# Patient Record
Sex: Male | Born: 1955 | Race: White | Hispanic: No | Smoking: Never smoker
Health system: Southern US, Community
[De-identification: ages and names within clinical notes are randomized; demographics above are authoritative.]

## PROBLEM LIST (undated history)

## (undated) DIAGNOSIS — G2589 Other specified extrapyramidal and movement disorders: Secondary | ICD-10-CM

## (undated) DIAGNOSIS — J3089 Other allergic rhinitis: Secondary | ICD-10-CM

## (undated) DIAGNOSIS — H905 Unspecified sensorineural hearing loss: Secondary | ICD-10-CM

## (undated) DIAGNOSIS — J302 Other seasonal allergic rhinitis: Secondary | ICD-10-CM

## (undated) DIAGNOSIS — Z91018 Allergy to other foods: Secondary | ICD-10-CM

## (undated) DIAGNOSIS — M5412 Radiculopathy, cervical region: Secondary | ICD-10-CM

## (undated) DIAGNOSIS — M545 Low back pain, unspecified: Secondary | ICD-10-CM

## (undated) DIAGNOSIS — M7021 Olecranon bursitis, right elbow: Secondary | ICD-10-CM

## (undated) DIAGNOSIS — R197 Diarrhea, unspecified: Secondary | ICD-10-CM

## (undated) DIAGNOSIS — M109 Gout, unspecified: Secondary | ICD-10-CM

## (undated) DIAGNOSIS — M62838 Other muscle spasm: Secondary | ICD-10-CM

## (undated) DIAGNOSIS — N529 Male erectile dysfunction, unspecified: Secondary | ICD-10-CM

## (undated) DIAGNOSIS — G930 Cerebral cysts: Secondary | ICD-10-CM

## (undated) DIAGNOSIS — G43109 Migraine with aura, not intractable, without status migrainosus: Secondary | ICD-10-CM

## (undated) DIAGNOSIS — K219 Gastro-esophageal reflux disease without esophagitis: Secondary | ICD-10-CM

## (undated) DIAGNOSIS — Z789 Other specified health status: Secondary | ICD-10-CM

## (undated) DIAGNOSIS — M9901 Segmental and somatic dysfunction of cervical region: Secondary | ICD-10-CM

## (undated) DIAGNOSIS — R079 Chest pain, unspecified: Secondary | ICD-10-CM

## (undated) DIAGNOSIS — F419 Anxiety disorder, unspecified: Secondary | ICD-10-CM

## (undated) DIAGNOSIS — Z6828 Body mass index (BMI) 28.0-28.9, adult: Secondary | ICD-10-CM

## (undated) DIAGNOSIS — I10 Essential (primary) hypertension: Secondary | ICD-10-CM

## (undated) DIAGNOSIS — R413 Other amnesia: Secondary | ICD-10-CM

## (undated) DIAGNOSIS — Z860100 Personal history of colon polyps, unspecified: Secondary | ICD-10-CM

## (undated) DIAGNOSIS — D225 Melanocytic nevi of trunk: Secondary | ICD-10-CM

## (undated) DIAGNOSIS — R55 Syncope and collapse: Secondary | ICD-10-CM

## (undated) DIAGNOSIS — M1990 Unspecified osteoarthritis, unspecified site: Secondary | ICD-10-CM

## (undated) DIAGNOSIS — E785 Hyperlipidemia, unspecified: Secondary | ICD-10-CM

## (undated) DIAGNOSIS — B009 Herpesviral infection, unspecified: Secondary | ICD-10-CM

## (undated) DIAGNOSIS — F32A Depression, unspecified: Secondary | ICD-10-CM

## (undated) HISTORY — DX: Hyperlipidemia, unspecified: E78.5

## (undated) HISTORY — DX: Unspecified sensorineural hearing loss: H90.5

## (undated) HISTORY — DX: Other specified health status: Z78.9

## (undated) HISTORY — DX: Unspecified osteoarthritis, unspecified site: M19.90

## (undated) HISTORY — DX: Allergy to other foods: Z91.018

## (undated) HISTORY — DX: Gout, unspecified: M10.9

## (undated) HISTORY — DX: Syncope and collapse: R55

## (undated) HISTORY — DX: Radiculopathy, cervical region: M54.12

## (undated) HISTORY — DX: Other allergic rhinitis: J30.89

## (undated) HISTORY — PX: COLONOSCOPY: SHX174

## (undated) HISTORY — DX: Low back pain, unspecified: M54.50

## (undated) HISTORY — DX: Herpesviral infection, unspecified: B00.9

## (undated) HISTORY — DX: Cerebral cysts: G93.0

## (undated) HISTORY — DX: Diarrhea, unspecified: R19.7

## (undated) HISTORY — DX: Gastro-esophageal reflux disease without esophagitis: K21.9

## (undated) HISTORY — DX: Melanocytic nevi of trunk: D22.5

## (undated) HISTORY — PX: ANKLE SURGERY: SHX546

## (undated) HISTORY — DX: Other muscle spasm: M62.838

## (undated) HISTORY — DX: Anxiety disorder, unspecified: F41.9

## (undated) HISTORY — DX: Essential (primary) hypertension: I10

## (undated) HISTORY — DX: Other amnesia: R41.3

## (undated) HISTORY — DX: Migraine with aura, not intractable, without status migrainosus: G43.109

## (undated) HISTORY — DX: Other specified extrapyramidal and movement disorders: G25.89

## (undated) HISTORY — DX: Other seasonal allergic rhinitis: J30.2

## (undated) HISTORY — PX: POLYPECTOMY: SHX149

## (undated) HISTORY — PX: WISDOM TOOTH EXTRACTION: SHX21

## (undated) HISTORY — DX: Olecranon bursitis, right elbow: M70.21

## (undated) HISTORY — DX: Personal history of colon polyps, unspecified: Z86.0100

## (undated) HISTORY — DX: Chest pain, unspecified: R07.9

## (undated) HISTORY — DX: Segmental and somatic dysfunction of cervical region: M99.01

## (undated) HISTORY — DX: Depression, unspecified: F32.A

## (undated) HISTORY — PX: TONSILLECTOMY: SUR1361

## (undated) HISTORY — DX: Male erectile dysfunction, unspecified: N52.9

## (undated) HISTORY — DX: Body mass index (BMI) 28.0-28.9, adult: Z68.28

---

## 1999-11-30 ENCOUNTER — Encounter: Admission: RE | Admit: 1999-11-30 | Discharge: 1999-11-30 | Payer: Self-pay | Admitting: Urology

## 1999-11-30 ENCOUNTER — Encounter: Payer: Self-pay | Admitting: Urology

## 2001-04-10 ENCOUNTER — Encounter: Payer: Self-pay | Admitting: *Deleted

## 2001-04-11 ENCOUNTER — Observation Stay (HOSPITAL_COMMUNITY): Admission: EM | Admit: 2001-04-11 | Discharge: 2001-04-12 | Payer: Self-pay | Admitting: Emergency Medicine

## 2006-06-08 LAB — HM COLONOSCOPY: HM Colonoscopy: NORMAL

## 2007-10-14 ENCOUNTER — Emergency Department (HOSPITAL_BASED_OUTPATIENT_CLINIC_OR_DEPARTMENT_OTHER): Admission: EM | Admit: 2007-10-14 | Discharge: 2007-10-14 | Payer: Self-pay | Admitting: Emergency Medicine

## 2010-06-23 ENCOUNTER — Ambulatory Visit: Payer: Self-pay | Admitting: Internal Medicine
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
## 2010-07-06 ENCOUNTER — Ambulatory Visit: Payer: Self-pay | Admitting: Internal Medicine

## 2010-07-07 ENCOUNTER — Ambulatory Visit: Payer: Self-pay | Admitting: Internal Medicine

## 2010-07-11 ENCOUNTER — Ambulatory Visit: Payer: Self-pay | Admitting: Internal Medicine

## 2010-07-22 ENCOUNTER — Ambulatory Visit: Payer: Self-pay | Admitting: Internal Medicine

## 2010-08-16 ENCOUNTER — Encounter: Payer: Self-pay | Admitting: Family Medicine

## 2010-08-16 DIAGNOSIS — E785 Hyperlipidemia, unspecified: Secondary | ICD-10-CM | POA: Insufficient documentation

## 2010-08-17 ENCOUNTER — Ambulatory Visit: Payer: Self-pay | Admitting: Internal Medicine

## 2012-04-30 LAB — HEPATIC FUNCTION PANEL
ALT: 20 U/L (ref 10–40)
AST: 21 U/L (ref 14–40)

## 2012-04-30 LAB — BASIC METABOLIC PANEL
Creatinine: 1.1 mg/dL (ref 0.6–1.3)
Glucose: 104 mg/dL
Potassium: 5.1 mmol/L (ref 3.4–5.3)

## 2012-04-30 LAB — CBC AND DIFFERENTIAL
HCT: 45 % (ref 41–53)
Hemoglobin: 15.7 g/dL (ref 13.5–17.5)
Platelets: 200 10*3/uL (ref 150–399)
WBC: 6.1 10^3/mL

## 2012-04-30 LAB — LIPID PANEL
Cholesterol: 250 mg/dL — AB (ref 0–200)
HDL: 44 mg/dL (ref 35–70)
LDL Cholesterol: 181 mg/dL
Triglycerides: 105 mg/dL (ref 40–160)

## 2012-05-06 ENCOUNTER — Ambulatory Visit: Payer: Self-pay | Admitting: Family Medicine

## 2012-05-08 ENCOUNTER — Ambulatory Visit (INDEPENDENT_AMBULATORY_CARE_PROVIDER_SITE_OTHER): Payer: BC Managed Care – PPO | Admitting: Family Medicine

## 2012-05-08 ENCOUNTER — Encounter: Payer: Self-pay | Admitting: Family Medicine

## 2012-05-08 VITALS — BP 139/87 | HR 67 | Temp 98.3°F | Ht 70.0 in | Wt 193.0 lb

## 2012-05-08 DIAGNOSIS — Z125 Encounter for screening for malignant neoplasm of prostate: Secondary | ICD-10-CM

## 2012-05-08 DIAGNOSIS — D235 Other benign neoplasm of skin of trunk: Secondary | ICD-10-CM

## 2012-05-08 DIAGNOSIS — R0789 Other chest pain: Secondary | ICD-10-CM | POA: Insufficient documentation

## 2012-05-08 DIAGNOSIS — D225 Melanocytic nevi of trunk: Secondary | ICD-10-CM

## 2012-05-08 DIAGNOSIS — Z0000 Encounter for general adult medical examination without abnormal findings: Secondary | ICD-10-CM

## 2012-05-08 DIAGNOSIS — E785 Hyperlipidemia, unspecified: Secondary | ICD-10-CM

## 2012-05-08 DIAGNOSIS — R413 Other amnesia: Secondary | ICD-10-CM

## 2012-05-08 LAB — THYROID PANEL WITH TSH
Free Thyroxine Index: 2.6 (ref 1.0–3.9)
T3 Uptake: 30.6 % (ref 22.5–37.0)
T4, Total: 8.4 ug/dL (ref 5.0–12.5)
TSH: 0.984 u[IU]/mL (ref 0.350–4.500)

## 2012-05-08 LAB — PSA: PSA: 0.22 ng/mL (ref <=4.00)

## 2012-05-08 MED ORDER — PITAVASTATIN CALCIUM 2 MG PO TABS: 1.0000 | ORAL_TABLET | Freq: Every day | ORAL | Status: DC

## 2012-05-08 NOTE — Progress Notes (Signed)
Patient ID: Clifford Gomez, male   DOB: 05-05-1955, 57 y.o.   MRN: [id number] SUBJECTIVE:   HPI: Patient comes in for annual physical. He is well known to me for many years. He is in relatively good state of health. Major problem only hyperlipidemia. He has less control and treated by livalo 2 mg daily. No problems with that. On March 25 patient was seen at one of the a [facility] medicine practices.Marland Kitchen He was having atypical chest pain and it was related to stress. He's had a couple stress tests in the past and they're all normal doesn't think its heart. Get some labs done and was normal. He had EKG done and was normal. He also had his lipid panel done and that reflected elevated cholesterol. He has been out of his livalo. He is feeling fine now.  PMH/PSH: reviewed/updated in Epic  SH/FH: reviewed/updated in Epic  Allergies: reviewed/updated in Epic  Medications: reviewed/updated in Epic  Immunizations: reviewed/updated in Epic  ROS: Review of all systems is negative  OBJECTIVE:     Appearance: The patient appeared well nourished and normally developed.  Vital signs as documented. BP 139/87  Pulse 67  Temp(Src) 98.3 F (36.8 C) (Oral)  Ht 5\' 10"  (1.778 m)  Wt 193 lb (87.544 kg)  BMI 27.69 kg/m2   Head exam is unremarkable. No scleral icterus or corneal arcus noted. . Eyes: normal Ears:normal Throat: Normal  Neck is without jugular venous distension, thyromegaly, or carotid bruits. Carotid r upstrokes are brisk bilaterally..  Lungs exam:are clear to auscultation and percussion.   Cardiac exam reveals the PMI to be normally sized and situated. Rhythm is regular. First and second heart sounds normal. No murmurs, rubs or gallops.  Abdominal exam reveals normal bowel sounds, no masses, no organomegaly and no aortic enlargement.   GU: varicoceles in the left scrotum. Penis and testes normal. Normal otherwise no hernia. Rectal examination was normal prostate normal Hemoccult  negative brown stools.  Extremities are nonedematous and both femoral and pedal pulses are normal.  Neurologic: Oriented to name, place, time. Nonfocal.  Lymphatics: Normal  Vasculature: Normal peripheral pulses 2+ in extremities.  Skin examination: Multiple skin tags strawberry nevi and melanocytic nevi. None look suspicious today.  ASSESSMENT:  Annual physical exam - Diet avoids meat. Eats fish, veggies, salads.  Other and unspecified hyperlipidemia - Plan: Thyroid Panel With TSH  Atypical chest pain - occasional sharp left chest pain. Stress related. Stress tests have been normal. - Plan: Thyroid Panel With TSH  Screening for prostate cancer - Plan: PSA  Memory impairment - Mini-Mental Status exam revealed a score of 29/30. His wife is of concern that he may have a small amount of short-term memory impairment.  - Plan: Thyroid Panel With TSH  Hyperlipidemia - has been out of his statin  Melanocytic nevi of trunk - sees a dermatologist annually.   PLAN:  Orders Placed This Encounter  Procedures  . CBC and differential    This external order was created through the Results Console.  . Basic metabolic panel    This external order was created through the Results Console.  . Lipid panel    This external order was created through the Results Console.  . Hepatic function panel    This external order was created through the Results Console.  . PSA  . Thyroid Panel With TSH  . HM COLONOSCOPY    This external order was created through the Results Console.   No results  found for this or any previous visit (from the past 24 hour(s)).                               Meds ordered this encounter  Medications  . DISCONTD: Pitavastatin Calcium (LIVALO) 2 MG TABS    Sig: Take by mouth daily.  . Pitavastatin Calcium (LIVALO) 2 MG TABS    Sig: Take 1 tablet (2 mg total) by mouth daily.    Dispense:  30 tablet    Refill:  11  Diet and Exercise discussed with patient. For  nutrition information, I recommend books: Eat to Live by Dr Monico Hoar. Prevent and Reverse Heart Disease by Dr Suzzette Righter.  Exercise recommendations are:  If unable to walk, then the patient can exercise in a chair 3 times a day. By flapping arms like a bird gently and raising legs outwards to the front.  If ambulatory, the patient can go for walks for 30 minutes 3 times a week. Then increase the intensity and duration as tolerated. Goal is to try to attain exercise frequency to 5 times a week. Best to perform resistance exercises 2 days a week and cardio type exercises 3 days per week. Discussed web site www.PCRM.org  Labs in 4 months at the [place] lab beside [facility].  RTC pending labs.  Kristiann Noyce P. Modesto Charon, M.D.

## 2012-05-08 NOTE — Patient Instructions (Addendum)
Diet and Exercise discussed with patient. For nutrition information, I recommend books: Eat to Live by Dr Joel Fuhrman. Prevent and Reverse Heart Disease by Dr Caldwell Esselstyn.  Exercise recommendations are:  If unable to walk, then the patient can exercise in a chair 3 times a day. By flapping arms like a bird gently and raising legs outwards to the front.  If ambulatory, the patient can go for walks for 30 minutes 3 times a week. Then increase the intensity and duration as tolerated. Goal is to try to attain exercise frequency to 5 times a week. Best to perform resistance exercises 2 days a week and cardio type exercises 3 days per week. Hypertriglyceridemia  Diet for High blood levels of Triglycerides Most fats in food are triglycerides. Triglycerides in your blood are stored as fat in your body. High levels of triglycerides in your blood may put you at a greater risk for heart disease and stroke.  Normal triglyceride levels are less than 150 mg/dL. Borderline high levels are 150-199 mg/dl. High levels are 200 - 499 mg/dL, and very high triglyceride levels are greater than 500 mg/dL. The decision to treat high triglycerides is generally based on the level. For people with borderline or high triglyceride levels, treatment includes weight loss and exercise. Drugs are recommended for people with very high triglyceride levels. Many people who need treatment for high triglyceride levels have metabolic syndrome. This syndrome is a collection of disorders that often include: insulin resistance, high blood pressure, blood clotting problems, high cholesterol and triglycerides. TESTING PROCEDURE FOR TRIGLYCERIDES  You should not eat 4 hours before getting your triglycerides measured. The normal range of triglycerides is between 10 and 250 milligrams per deciliter (mg/dl). Some people may have extreme levels (1000 or above), but your triglyceride level may be too high if it is above 150 mg/dl, depending  on what other risk factors you have for heart disease.  People with high blood triglycerides may also have high blood cholesterol levels. If you have high blood cholesterol as well as high blood triglycerides, your risk for heart disease is probably greater than if you only had high triglycerides. High blood cholesterol is one of the main risk factors for heart disease. CHANGING YOUR DIET  Your weight can affect your blood triglyceride level. If you are more than 20% above your ideal body weight, you may be able to lower your blood triglycerides by losing weight. Eating less and exercising regularly is the best way to combat this. Fat provides more calories than any other food. The best way to lose weight is to eat less fat. Only 30% of your total calories should come from fat. Less than 7% of your diet should come from saturated fat. A diet low in fat and saturated fat is the same as a diet to decrease blood cholesterol. By eating a diet lower in fat, you may lose weight, lower your blood cholesterol, and lower your blood triglyceride level.  Eating a diet low in fat, especially saturated fat, may also help you lower your blood triglyceride level. Ask your dietitian to help you figure how much fat you can eat based on the number of calories your caregiver has prescribed for you.  Exercise, in addition to helping with weight loss may also help lower triglyceride levels.   Alcohol can increase blood triglycerides. You may need to stop drinking alcoholic beverages.  Too much carbohydrate in your diet may also increase your blood triglycerides. Some complex carbohydrates are necessary   in your diet. These may include bread, rice, potatoes, other starchy vegetables and cereals.  Reduce "simple" carbohydrates. These may include pure sugars, candy, honey, and jelly without losing other nutrients. If you have the kind of high blood triglycerides that is affected by the amount of carbohydrates in your diet, you  will need to eat less sugar and less high-sugar foods. Your caregiver can help you with this.  Adding 2-4 grams of fish oil (EPA+ DHA) may also help lower triglycerides. Speak with your caregiver before adding any supplements to your regimen. Following the Diet  Maintain your ideal weight. Your caregivers can help you with a diet. Generally, eating less food and getting more exercise will help you lose weight. Joining a weight control group may also help. Ask your caregivers for a good weight control group in your area.  Eat low-fat foods instead of high-fat foods. This can help you lose weight too.  These foods are lower in fat. Eat MORE of these:   Dried beans, peas, and lentils.  Egg whites.  Low-fat cottage cheese.  Fish.  Lean cuts of meat, such as round, sirloin, rump, and flank (cut extra fat off meat you fix).  Whole grain breads, cereals and pasta.  Skim and nonfat dry milk.  Low-fat yogurt.  Poultry without the skin.  Cheese made with skim or part-skim milk, such as mozzarella, parmesan, farmers', ricotta, or pot cheese. These are higher fat foods. Eat LESS of these:   Whole milk and foods made from whole milk, such as American, blue, cheddar, monterey jack, and swiss cheese  High-fat meats, such as luncheon meats, sausages, knockwurst, bratwurst, hot dogs, ribs, corned beef, ground pork, and regular ground beef.  Fried foods. Limit saturated fats in your diet. Substituting unsaturated fat for saturated fat may decrease your blood triglyceride level. You will need to read package labels to know which products contain saturated fats.  These foods are high in saturated fat. Eat LESS of these:   Fried pork skins.  Whole milk.  Skin and fat from poultry.  Palm oil.  Butter.  Shortening.  Cream cheese.  Bacon.  Margarines and baked goods made from listed oils.  Vegetable shortenings.  Chitterlings.  Fat from meats.  Coconut oil.  Palm kernel  oil.  Lard.  Cream.  Sour cream.  Fatback.  Coffee whiteners and non-dairy creamers made with these oils.  Cheese made from whole milk. Use unsaturated fats (both polyunsaturated and monounsaturated) moderately. Remember, even though unsaturated fats are better than saturated fats; you still want a diet low in total fat.  These foods are high in unsaturated fat:   Canola oil.  Sunflower oil.  Mayonnaise.  Almonds.  Peanuts.  Pine nuts.  Margarines made with these oils.  Safflower oil.  Olive oil.  Avocados.  Cashews.  Peanut butter.  Sunflower seeds.  Soybean oil.  Peanut oil.  Olives.  Pecans.  Walnuts.  Pumpkin seeds. Avoid sugar and other high-sugar foods. This will decrease carbohydrates without decreasing other nutrients. Sugar in your food goes rapidly to your blood. When there is excess sugar in your blood, your liver may use it to make more triglycerides. Sugar also contains calories without other important nutrients.  Eat LESS of these:   Sugar, brown sugar, powdered sugar, jam, jelly, preserves, honey, syrup, molasses, pies, candy, cakes, cookies, frosting, pastries, colas, soft drinks, punches, fruit drinks, and regular gelatin.  Avoid alcohol. Alcohol, even more than sugar, may increase blood triglycerides. In addition,   alcohol is high in calories and low in nutrients. Ask for sparkling water, or a diet soft drink instead of an alcoholic beverage. Suggestions for planning and preparing meals   Bake, broil, grill or roast meats instead of frying.  Remove fat from meats and skin from poultry before cooking.  Add spices, herbs, lemon juice or vinegar to vegetables instead of salt, rich sauces or gravies.  Use a non-stick skillet without fat or use no-stick sprays.  Cool and refrigerate stews and broth. Then remove the hardened fat floating on the surface before serving.  Refrigerate meat drippings and skim off fat to make low-fat  gravies.  Serve more fish.  Use less butter, margarine and other high-fat spreads on bread or vegetables.  Use skim or reconstituted non-fat dry milk for cooking.  Cook with low-fat cheeses.  Substitute low-fat yogurt or cottage cheese for all or part of the sour cream in recipes for sauces, dips or congealed salads.  Use half yogurt/half mayonnaise in salad recipes.  Substitute evaporated skim milk for cream. Evaporated skim milk or reconstituted non-fat dry milk can be whipped and substituted for whipped cream in certain recipes.  Choose fresh fruits for dessert instead of high-fat foods such as pies or cakes. Fruits are naturally low in fat. When Dining Out   Order low-fat appetizers such as fruit or vegetable juice, pasta with vegetables or tomato sauce.  Select clear, rather than cream soups.  Ask that dressings and gravies be served on the side. Then use less of them.  Order foods that are baked, broiled, poached, steamed, stir-fried, or roasted.  Ask for margarine instead of butter, and use only a small amount.  Drink sparkling water, unsweetened tea or coffee, or diet soft drinks instead of alcohol or other sweet beverages. QUESTIONS AND ANSWERS ABOUT OTHER FATS IN THE BLOOD: SATURATED FAT, TRANS FAT, AND CHOLESTEROL What is trans fat? Trans fat is a type of fat that is formed when vegetable oil is hardened through a process called hydrogenation. This process helps makes foods more solid, gives them shape, and prolongs their shelf life. Trans fats are also called hydrogenated or partially hydrogenated oils.  What do saturated fat, trans fat, and cholesterol in foods have to do with heart disease? Saturated fat, trans fat, and cholesterol in the diet all raise the level of LDL "bad" cholesterol in the blood. The higher the LDL cholesterol, the greater the risk for coronary heart disease (CHD). Saturated fat and trans fat raise LDL similarly.  What foods contain saturated  fat, trans fat, and cholesterol? High amounts of saturated fat are found in animal products, such as fatty cuts of meat, chicken skin, and full-fat dairy products like butter, whole milk, cream, and cheese, and in tropical vegetable oils such as palm, palm kernel, and coconut oil. Trans fat is found in some of the same foods as saturated fat, such as vegetable shortening, some margarines (especially hard or stick margarine), crackers, cookies, baked goods, fried foods, salad dressings, and other processed foods made with partially hydrogenated vegetable oils. Small amounts of trans fat also occur naturally in some animal products, such as milk products, beef, and lamb. Foods high in cholesterol include liver, other organ meats, egg yolks, shrimp, and full-fat dairy products. How can I use the new food label to make heart-healthy food choices? Check the Nutrition Facts panel of the food label. Choose foods lower in saturated fat, trans fat, and cholesterol. For saturated fat and cholesterol, you can also   use the Percent Daily Value (%DV): 5% DV or less is low, and 20% DV or more is high. (There is no %DV for trans fat.) Use the Nutrition Facts panel to choose foods low in saturated fat and cholesterol, and if the trans fat is not listed, read the ingredients and limit products that list shortening or hydrogenated or partially hydrogenated vegetable oil, which tend to be high in trans fat. POINTS TO REMEMBER:   Discuss your risk for heart disease with your caregivers, and take steps to reduce risk factors.  Change your diet. Choose foods that are low in saturated fat, trans fat, and cholesterol.  Add exercise to your daily routine if it is not already being done. Participate in physical activity of moderate intensity, like brisk walking, for at least 30 minutes on most, and preferably all days of the week. No time? Break the 30 minutes into three, 10-minute segments during the day.  Stop smoking. If you do  smoke, contact your caregiver to discuss ways in which they can help you quit.  Do not use street drugs.  Maintain a normal weight.  Maintain a healthy blood pressure.  Keep up with your blood work for checking the fats in your blood as directed by your caregiver. Document Released: 11/11/2003 Document Revised: 07/25/2011 Document Reviewed: 06/08/2008 [employer] Patient Information 2013 [employer].  

## 2012-05-09 NOTE — Progress Notes (Signed)
Quick Note:  Call patient. Labs normal. No change in plan. ______ 

## 2012-07-24 ENCOUNTER — Telehealth: Payer: Self-pay | Admitting: Family Medicine

## 2012-07-26 ENCOUNTER — Other Ambulatory Visit: Payer: Self-pay

## 2012-07-26 ENCOUNTER — Other Ambulatory Visit: Payer: Self-pay | Admitting: Family Medicine

## 2012-07-26 MED ORDER — PITAVASTATIN CALCIUM 2 MG PO TABS: 1.0000 | ORAL_TABLET | Freq: Every day | ORAL | Status: DC

## 2012-07-26 NOTE — Telephone Encounter (Signed)
Pt notified rx called to [address]   [phone]

## 2012-07-26 NOTE — Telephone Encounter (Signed)
Printed to be called into a new pharmacy.

## 2012-08-07 ENCOUNTER — Telehealth: Payer: Self-pay | Admitting: Family Medicine

## 2012-08-07 NOTE — Telephone Encounter (Signed)
Spoke with pt and requesting we mail [employer] savings card  Pt aware will mail savings card.

## 2012-08-21 ENCOUNTER — Telehealth: Payer: Self-pay | Admitting: Family Medicine

## 2012-08-21 NOTE — Telephone Encounter (Signed)
fpw to address 

## 2012-08-22 NOTE — Telephone Encounter (Signed)
PT NOTIFIED  

## 2012-08-22 NOTE — Telephone Encounter (Signed)
Dr Charlotte Sanes at [place] or DR Melvia Heaps at [facility]

## 2013-02-06 HISTORY — PX: COLONOSCOPY: SHX174

## 2013-03-17 ENCOUNTER — Encounter: Payer: Self-pay | Admitting: Internal Medicine

## 2013-04-01 ENCOUNTER — Ambulatory Visit (AMBULATORY_SURGERY_CENTER): Payer: Self-pay

## 2013-04-01 VITALS — Ht 70.0 in | Wt 196.8 lb

## 2013-04-01 DIAGNOSIS — Z1211 Encounter for screening for malignant neoplasm of colon: Secondary | ICD-10-CM

## 2013-04-01 MED ORDER — MOVIPREP 100 G PO SOLR: ORAL | Status: DC

## 2013-04-03 ENCOUNTER — Encounter: Payer: Self-pay | Admitting: Internal Medicine

## 2013-04-15 ENCOUNTER — Encounter: Payer: Self-pay | Admitting: Internal Medicine

## 2013-04-15 ENCOUNTER — Ambulatory Visit (AMBULATORY_SURGERY_CENTER): Payer: BC Managed Care – PPO | Admitting: Internal Medicine

## 2013-04-15 VITALS — BP 135/73 | HR 77 | Temp 97.4°F | Resp 20 | Ht 70.0 in | Wt 196.0 lb

## 2013-04-15 DIAGNOSIS — Z1211 Encounter for screening for malignant neoplasm of colon: Secondary | ICD-10-CM

## 2013-04-15 DIAGNOSIS — D126 Benign neoplasm of colon, unspecified: Secondary | ICD-10-CM

## 2013-04-15 MED ORDER — SODIUM CHLORIDE 0.9 % IV SOLN: 500.0000 mL | INTRAVENOUS | Status: DC

## 2013-04-15 NOTE — Op Note (Signed)
[place]  [address]. [place], [id number]   COLONOSCOPY PROCEDURE REPORT  PATIENT: Clifford Gomez, Clifford Gomez  MR#: [id number] BIRTHDATE: 27-Apr-1955 , 88  yrs. old GENDER: Male ENDOSCOPIST: Eustace Quail, MD REFERRED YT:KZSWFUX Jacelyn Grip, M.D. PROCEDURE DATE:  04/15/2013 PROCEDURE:   Colonoscopy with snare polypectomy First Screening Colonoscopy - Avg.  risk and is 50 yrs.  old or older - No.  Prior Negative Screening - Now for repeat screening. N/A  History of Adenoma - Now for follow-up colonoscopy & has been > or = to 3 yrs.  Yes hx of adenoma.  Has been 3 or more years since last colonoscopy.  Polyps Removed Today? Yes. ASA CLASS:   Class II INDICATIONS:Patient's personal history of colon polyps.  He  reports prior history of polyps elsewhere. said to be due for followup. No records MEDICATIONS: MAC sedation, administered by CRNA and propofol (Diprivan) 300mg  IV  DESCRIPTION OF PROCEDURE:   After the risks benefits and alternatives of the procedure were thoroughly explained, informed consent was obtained.  A digital rectal exam revealed no abnormalities of the rectum.   The LB [id number]  endoscope was introduced through the anus and advanced to the cecum, which was identified by both the appendix and ileocecal valve. No adverse events experienced.   The quality of the prep was excellent, using MoviPrep  The instrument was then slowly withdrawn as the colon was fully examined.  COLON FINDINGS:  There appeared to be a marking tattooed in the right colon.Three diminutive polyps were found in the ascending (2) and transverse colon.  A polypectomy was performed with a cold snare.  The resection was complete and the polyp tissue was completely retrieved.   The colon mucosa was otherwise normal throughout.  Retroflexed views revealed no abnormalities. The time to cecum=1 minutes 36 seconds.  Withdrawal time=15 minutes 06 seconds.  The scope was withdrawn and the  procedure completed. COMPLICATIONS: There were no complications.  ENDOSCOPIC IMPRESSION: 1.   Three diminutive polyps were found in the ascending and transverse colon; polypectomy was performed with a cold snare 2.   The colon mucosa was otherwise normal  RECOMMENDATIONS: 1. Repeat Colonoscopy in 5 years.   eSigned:  Eustace Quail, MD 04/15/2013 1:59 PM   cc: The Patient and Ulanda Edison MD

## 2013-04-15 NOTE — Patient Instructions (Addendum)
YOU HAD AN ENDOSCOPIC PROCEDURE TODAY AT THE [facility]: Refer to the procedure report that was given to you for any specific questions about what was found during the examination.  If the procedure report does not answer your questions, please call your gastroenterologist to clarify.  If you requested that your care partner not be given the details of your procedure findings, then the procedure report has been included in a sealed envelope for you to review at your convenience later.  YOU SHOULD EXPECT: Some feelings of bloating in the abdomen. Passage of more gas than usual.  Walking can help get rid of the air that was put into your GI tract during the procedure and reduce the bloating. If you had a lower endoscopy (such as a colonoscopy or flexible sigmoidoscopy) you may notice spotting of blood in your stool or on the toilet paper. If you underwent a bowel prep for your procedure, then you may not have a normal bowel movement for a few days.  DIET: Your first meal following the procedure should be a light meal and then it is ok to progress to your normal diet.  A half-sandwich or bowl of soup is an example of a good first meal.  Heavy or fried foods are harder to digest and may make you feel nauseous or bloated.  Likewise meals heavy in dairy and vegetables can cause extra gas to form and this can also increase the bloating.  Drink plenty of fluids but you should avoid alcoholic beverages for 24 hours.  ACTIVITY: Your care partner should take you home directly after the procedure.  You should plan to take it easy, moving slowly for the rest of the day.  You can resume normal activity the day after the procedure however you should NOT DRIVE or use heavy machinery for 24 hours (because of the sedation medicines used during the test).    SYMPTOMS TO REPORT IMMEDIATELY: A gastroenterologist can be reached at any hour.  During normal business hours, 8:30 AM to 5:00 PM Monday through Friday,  call [phone].  After hours and on weekends, please call the GI answering service at [phone] who will take a message and have the physician on call contact you.   Following lower endoscopy (colonoscopy or flexible sigmoidoscopy):  Excessive amounts of blood in the stool  Significant tenderness or worsening of abdominal pains  Swelling of the abdomen that is new, acute  Fever of 100F or higher  FOLLOW UP: If any biopsies were taken you will be contacted by phone or by letter within the next 1-3 weeks.  Call your gastroenterologist if you have not heard about the biopsies in 3 weeks.  Our staff will call the home number listed on your records the next business day following your procedure to check on you and address any questions or concerns that you may have at that time regarding the information given to you following your procedure. This is a courtesy call and so if there is no answer at the home number and we have not heard from you through the emergency physician on call, we will assume that you have returned to your regular daily activities without incident.  SIGNATURES/CONFIDENTIALITY: You and/or your care partner have signed paperwork which will be entered into your electronic medical record.  These signatures attest to the fact that that the information above on your After Visit Summary has been reviewed and is understood.  Full responsibility of the confidentiality of this   discharge information lies with you and/or your care-partner.  Repeat Colonoscopy in 5 years per Dr. Henrene Pastor.

## 2013-04-15 NOTE — Progress Notes (Signed)
Called to room to assist during endoscopic procedure.  Patient ID and intended procedure confirmed with present staff. Received instructions for my participation in the procedure from the performing physician.  

## 2013-04-15 NOTE — Progress Notes (Signed)
Report to pacu rn, vss, bbs=clear 

## 2013-04-15 NOTE — Progress Notes (Signed)
Wife has medical record release paper for Dr. Cristina Gong.   She wanted to check at home and be sure that it was the correct doctor.  She will send it back to Dr. Henrene Pastor.

## 2013-04-16 ENCOUNTER — Telehealth: Payer: Self-pay | Admitting: *Deleted

## 2013-04-16 ENCOUNTER — Encounter: Payer: Self-pay | Admitting: Family Medicine

## 2013-04-16 DIAGNOSIS — Z860100 Personal history of colon polyps, unspecified: Secondary | ICD-10-CM | POA: Insufficient documentation

## 2013-04-16 NOTE — Telephone Encounter (Signed)
  Follow up Call-  Call back number 04/15/2013  Post procedure Call Back phone  # [phone]  Permission to leave phone message Yes     Patient questions:  Do you have a fever, pain , or abdominal swelling? no Pain Score  0 *  Have you tolerated food without any problems? yes  Have you been able to return to your normal activities? yes  Do you have any questions about your discharge instructions: Diet   no Medications  no Follow up visit  no  Do you have questions or concerns about your Care? no  Actions: * If pain score is 4 or above: No action needed, pain <4.

## 2013-04-21 ENCOUNTER — Encounter: Payer: Self-pay | Admitting: Internal Medicine

## 2013-07-21 ENCOUNTER — Other Ambulatory Visit: Payer: Self-pay | Admitting: Dermatology

## 2013-08-21 ENCOUNTER — Other Ambulatory Visit: Payer: Self-pay | Admitting: *Deleted

## 2013-10-07 DIAGNOSIS — M5412 Radiculopathy, cervical region: Secondary | ICD-10-CM | POA: Insufficient documentation

## 2014-02-17 ENCOUNTER — Other Ambulatory Visit: Payer: Self-pay | Admitting: Dermatology

## 2015-05-17 DIAGNOSIS — M9904 Segmental and somatic dysfunction of sacral region: Secondary | ICD-10-CM | POA: Diagnosis not present

## 2015-05-17 DIAGNOSIS — M62830 Muscle spasm of back: Secondary | ICD-10-CM | POA: Diagnosis not present

## 2015-05-17 DIAGNOSIS — M9903 Segmental and somatic dysfunction of lumbar region: Secondary | ICD-10-CM | POA: Diagnosis not present

## 2015-05-17 DIAGNOSIS — M5417 Radiculopathy, lumbosacral region: Secondary | ICD-10-CM | POA: Diagnosis not present

## 2015-05-20 DIAGNOSIS — M5417 Radiculopathy, lumbosacral region: Secondary | ICD-10-CM | POA: Diagnosis not present

## 2015-05-20 DIAGNOSIS — M9904 Segmental and somatic dysfunction of sacral region: Secondary | ICD-10-CM | POA: Diagnosis not present

## 2015-05-20 DIAGNOSIS — M62830 Muscle spasm of back: Secondary | ICD-10-CM | POA: Diagnosis not present

## 2015-05-20 DIAGNOSIS — M9903 Segmental and somatic dysfunction of lumbar region: Secondary | ICD-10-CM | POA: Diagnosis not present

## 2015-06-08 DIAGNOSIS — M545 Low back pain: Secondary | ICD-10-CM | POA: Diagnosis not present

## 2015-06-08 DIAGNOSIS — Z0000 Encounter for general adult medical examination without abnormal findings: Secondary | ICD-10-CM | POA: Diagnosis not present

## 2015-06-08 DIAGNOSIS — M2550 Pain in unspecified joint: Secondary | ICD-10-CM | POA: Diagnosis not present

## 2015-06-08 DIAGNOSIS — M791 Myalgia: Secondary | ICD-10-CM | POA: Diagnosis not present

## 2015-06-08 DIAGNOSIS — Z0001 Encounter for general adult medical examination with abnormal findings: Secondary | ICD-10-CM | POA: Diagnosis not present

## 2015-06-08 DIAGNOSIS — E7800 Pure hypercholesterolemia, unspecified: Secondary | ICD-10-CM | POA: Diagnosis not present

## 2015-06-08 DIAGNOSIS — R202 Paresthesia of skin: Secondary | ICD-10-CM | POA: Diagnosis not present

## 2015-06-08 DIAGNOSIS — Z125 Encounter for screening for malignant neoplasm of prostate: Secondary | ICD-10-CM | POA: Diagnosis not present

## 2015-08-18 DIAGNOSIS — R899 Unspecified abnormal finding in specimens from other organs, systems and tissues: Secondary | ICD-10-CM | POA: Diagnosis not present

## 2015-08-18 DIAGNOSIS — Z209 Contact with and (suspected) exposure to unspecified communicable disease: Secondary | ICD-10-CM | POA: Diagnosis not present

## 2015-08-23 DIAGNOSIS — L918 Other hypertrophic disorders of the skin: Secondary | ICD-10-CM | POA: Diagnosis not present

## 2015-08-23 DIAGNOSIS — D485 Neoplasm of uncertain behavior of skin: Secondary | ICD-10-CM | POA: Diagnosis not present

## 2015-08-23 DIAGNOSIS — L570 Actinic keratosis: Secondary | ICD-10-CM | POA: Diagnosis not present

## 2015-08-23 DIAGNOSIS — D1801 Hemangioma of skin and subcutaneous tissue: Secondary | ICD-10-CM | POA: Diagnosis not present

## 2015-08-23 DIAGNOSIS — D225 Melanocytic nevi of trunk: Secondary | ICD-10-CM | POA: Diagnosis not present

## 2015-12-14 DIAGNOSIS — R899 Unspecified abnormal finding in specimens from other organs, systems and tissues: Secondary | ICD-10-CM | POA: Diagnosis not present

## 2015-12-14 DIAGNOSIS — M791 Myalgia: Secondary | ICD-10-CM | POA: Diagnosis not present

## 2015-12-14 DIAGNOSIS — E7800 Pure hypercholesterolemia, unspecified: Secondary | ICD-10-CM | POA: Diagnosis not present

## 2015-12-14 DIAGNOSIS — Z209 Contact with and (suspected) exposure to unspecified communicable disease: Secondary | ICD-10-CM | POA: Diagnosis not present

## 2015-12-15 DIAGNOSIS — M9902 Segmental and somatic dysfunction of thoracic region: Secondary | ICD-10-CM | POA: Diagnosis not present

## 2015-12-15 DIAGNOSIS — M5415 Radiculopathy, thoracolumbar region: Secondary | ICD-10-CM | POA: Diagnosis not present

## 2015-12-15 DIAGNOSIS — M9904 Segmental and somatic dysfunction of sacral region: Secondary | ICD-10-CM | POA: Diagnosis not present

## 2015-12-15 DIAGNOSIS — M9903 Segmental and somatic dysfunction of lumbar region: Secondary | ICD-10-CM | POA: Diagnosis not present

## 2016-02-29 DIAGNOSIS — D2262 Melanocytic nevi of left upper limb, including shoulder: Secondary | ICD-10-CM | POA: Diagnosis not present

## 2016-02-29 DIAGNOSIS — D2261 Melanocytic nevi of right upper limb, including shoulder: Secondary | ICD-10-CM | POA: Diagnosis not present

## 2016-02-29 DIAGNOSIS — D485 Neoplasm of uncertain behavior of skin: Secondary | ICD-10-CM | POA: Diagnosis not present

## 2016-02-29 DIAGNOSIS — D225 Melanocytic nevi of trunk: Secondary | ICD-10-CM | POA: Diagnosis not present

## 2016-02-29 DIAGNOSIS — D1801 Hemangioma of skin and subcutaneous tissue: Secondary | ICD-10-CM | POA: Diagnosis not present

## 2016-02-29 DIAGNOSIS — L820 Inflamed seborrheic keratosis: Secondary | ICD-10-CM | POA: Diagnosis not present

## 2016-02-29 DIAGNOSIS — L570 Actinic keratosis: Secondary | ICD-10-CM | POA: Diagnosis not present

## 2016-04-28 DIAGNOSIS — M9905 Segmental and somatic dysfunction of pelvic region: Secondary | ICD-10-CM | POA: Diagnosis not present

## 2016-04-28 DIAGNOSIS — M9903 Segmental and somatic dysfunction of lumbar region: Secondary | ICD-10-CM | POA: Diagnosis not present

## 2016-04-28 DIAGNOSIS — M9904 Segmental and somatic dysfunction of sacral region: Secondary | ICD-10-CM | POA: Diagnosis not present

## 2016-04-28 DIAGNOSIS — M5417 Radiculopathy, lumbosacral region: Secondary | ICD-10-CM | POA: Diagnosis not present

## 2016-05-02 DIAGNOSIS — M9904 Segmental and somatic dysfunction of sacral region: Secondary | ICD-10-CM | POA: Diagnosis not present

## 2016-05-02 DIAGNOSIS — M9903 Segmental and somatic dysfunction of lumbar region: Secondary | ICD-10-CM | POA: Diagnosis not present

## 2016-05-02 DIAGNOSIS — M5417 Radiculopathy, lumbosacral region: Secondary | ICD-10-CM | POA: Diagnosis not present

## 2016-05-02 DIAGNOSIS — M9905 Segmental and somatic dysfunction of pelvic region: Secondary | ICD-10-CM | POA: Diagnosis not present

## 2016-06-11 DIAGNOSIS — Z23 Encounter for immunization: Secondary | ICD-10-CM | POA: Diagnosis not present

## 2016-06-11 DIAGNOSIS — S91331A Puncture wound without foreign body, right foot, initial encounter: Secondary | ICD-10-CM | POA: Diagnosis not present

## 2016-06-13 DIAGNOSIS — Z125 Encounter for screening for malignant neoplasm of prostate: Secondary | ICD-10-CM | POA: Diagnosis not present

## 2016-06-13 DIAGNOSIS — Z0000 Encounter for general adult medical examination without abnormal findings: Secondary | ICD-10-CM | POA: Diagnosis not present

## 2016-06-13 DIAGNOSIS — E7800 Pure hypercholesterolemia, unspecified: Secondary | ICD-10-CM | POA: Diagnosis not present

## 2016-06-13 DIAGNOSIS — N529 Male erectile dysfunction, unspecified: Secondary | ICD-10-CM | POA: Diagnosis not present

## 2016-08-07 DIAGNOSIS — I10 Essential (primary) hypertension: Secondary | ICD-10-CM | POA: Diagnosis not present

## 2016-08-07 DIAGNOSIS — R0789 Other chest pain: Secondary | ICD-10-CM | POA: Diagnosis not present

## 2016-08-07 DIAGNOSIS — E7800 Pure hypercholesterolemia, unspecified: Secondary | ICD-10-CM | POA: Diagnosis not present

## 2016-08-07 DIAGNOSIS — R739 Hyperglycemia, unspecified: Secondary | ICD-10-CM | POA: Diagnosis not present

## 2016-08-15 DIAGNOSIS — D1800 Hemangioma unspecified site: Secondary | ICD-10-CM | POA: Diagnosis not present

## 2016-08-15 DIAGNOSIS — D1801 Hemangioma of skin and subcutaneous tissue: Secondary | ICD-10-CM | POA: Diagnosis not present

## 2016-08-15 DIAGNOSIS — L570 Actinic keratosis: Secondary | ICD-10-CM | POA: Diagnosis not present

## 2016-08-15 DIAGNOSIS — D485 Neoplasm of uncertain behavior of skin: Secondary | ICD-10-CM | POA: Diagnosis not present

## 2016-08-15 DIAGNOSIS — D2272 Melanocytic nevi of left lower limb, including hip: Secondary | ICD-10-CM | POA: Diagnosis not present

## 2016-08-15 DIAGNOSIS — D2261 Melanocytic nevi of right upper limb, including shoulder: Secondary | ICD-10-CM | POA: Diagnosis not present

## 2016-08-15 DIAGNOSIS — D2271 Melanocytic nevi of right lower limb, including hip: Secondary | ICD-10-CM | POA: Diagnosis not present

## 2016-08-17 DIAGNOSIS — R0789 Other chest pain: Secondary | ICD-10-CM | POA: Diagnosis not present

## 2016-08-18 DIAGNOSIS — R0789 Other chest pain: Secondary | ICD-10-CM | POA: Diagnosis not present

## 2016-09-28 DIAGNOSIS — M4692 Unspecified inflammatory spondylopathy, cervical region: Secondary | ICD-10-CM | POA: Diagnosis not present

## 2016-09-28 DIAGNOSIS — E7800 Pure hypercholesterolemia, unspecified: Secondary | ICD-10-CM | POA: Diagnosis not present

## 2016-09-28 DIAGNOSIS — M545 Low back pain: Secondary | ICD-10-CM | POA: Diagnosis not present

## 2016-09-28 DIAGNOSIS — R030 Elevated blood-pressure reading, without diagnosis of hypertension: Secondary | ICD-10-CM | POA: Diagnosis not present

## 2016-10-02 DIAGNOSIS — M2550 Pain in unspecified joint: Secondary | ICD-10-CM | POA: Diagnosis not present

## 2016-10-02 DIAGNOSIS — E7800 Pure hypercholesterolemia, unspecified: Secondary | ICD-10-CM | POA: Diagnosis not present

## 2016-10-20 DIAGNOSIS — R0789 Other chest pain: Secondary | ICD-10-CM | POA: Diagnosis not present

## 2016-10-20 DIAGNOSIS — I10 Essential (primary) hypertension: Secondary | ICD-10-CM | POA: Diagnosis not present

## 2016-10-20 DIAGNOSIS — M2550 Pain in unspecified joint: Secondary | ICD-10-CM | POA: Diagnosis not present

## 2016-10-20 DIAGNOSIS — E7800 Pure hypercholesterolemia, unspecified: Secondary | ICD-10-CM | POA: Diagnosis not present

## 2017-02-19 DIAGNOSIS — D2239 Melanocytic nevi of other parts of face: Secondary | ICD-10-CM | POA: Diagnosis not present

## 2017-02-19 DIAGNOSIS — L570 Actinic keratosis: Secondary | ICD-10-CM | POA: Diagnosis not present

## 2017-02-19 DIAGNOSIS — L821 Other seborrheic keratosis: Secondary | ICD-10-CM | POA: Diagnosis not present

## 2017-03-05 DIAGNOSIS — J069 Acute upper respiratory infection, unspecified: Secondary | ICD-10-CM | POA: Diagnosis not present

## 2017-03-05 DIAGNOSIS — R05 Cough: Secondary | ICD-10-CM | POA: Diagnosis not present

## 2017-04-03 DIAGNOSIS — R030 Elevated blood-pressure reading, without diagnosis of hypertension: Secondary | ICD-10-CM | POA: Diagnosis not present

## 2017-04-03 DIAGNOSIS — Z113 Encounter for screening for infections with a predominantly sexual mode of transmission: Secondary | ICD-10-CM | POA: Diagnosis not present

## 2017-04-03 DIAGNOSIS — M545 Low back pain: Secondary | ICD-10-CM | POA: Diagnosis not present

## 2017-04-03 DIAGNOSIS — B001 Herpesviral vesicular dermatitis: Secondary | ICD-10-CM | POA: Diagnosis not present

## 2017-04-03 DIAGNOSIS — R300 Dysuria: Secondary | ICD-10-CM | POA: Diagnosis not present

## 2017-04-03 DIAGNOSIS — E7800 Pure hypercholesterolemia, unspecified: Secondary | ICD-10-CM | POA: Diagnosis not present

## 2017-04-03 DIAGNOSIS — M4692 Unspecified inflammatory spondylopathy, cervical region: Secondary | ICD-10-CM | POA: Diagnosis not present

## 2017-08-13 DIAGNOSIS — I10 Essential (primary) hypertension: Secondary | ICD-10-CM | POA: Diagnosis not present

## 2017-08-13 DIAGNOSIS — E7800 Pure hypercholesterolemia, unspecified: Secondary | ICD-10-CM | POA: Diagnosis not present

## 2017-08-13 DIAGNOSIS — Z125 Encounter for screening for malignant neoplasm of prostate: Secondary | ICD-10-CM | POA: Diagnosis not present

## 2017-08-13 DIAGNOSIS — Z0000 Encounter for general adult medical examination without abnormal findings: Secondary | ICD-10-CM | POA: Diagnosis not present

## 2017-09-03 DIAGNOSIS — D224 Melanocytic nevi of scalp and neck: Secondary | ICD-10-CM | POA: Diagnosis not present

## 2017-09-03 DIAGNOSIS — L821 Other seborrheic keratosis: Secondary | ICD-10-CM | POA: Diagnosis not present

## 2017-09-03 DIAGNOSIS — D1801 Hemangioma of skin and subcutaneous tissue: Secondary | ICD-10-CM | POA: Diagnosis not present

## 2017-09-03 DIAGNOSIS — D2272 Melanocytic nevi of left lower limb, including hip: Secondary | ICD-10-CM | POA: Diagnosis not present

## 2017-09-03 DIAGNOSIS — L570 Actinic keratosis: Secondary | ICD-10-CM | POA: Diagnosis not present

## 2017-09-17 DIAGNOSIS — R197 Diarrhea, unspecified: Secondary | ICD-10-CM | POA: Diagnosis not present

## 2017-09-17 DIAGNOSIS — M791 Myalgia, unspecified site: Secondary | ICD-10-CM | POA: Diagnosis not present

## 2017-09-17 DIAGNOSIS — S0006XA Insect bite (nonvenomous) of scalp, initial encounter: Secondary | ICD-10-CM | POA: Diagnosis not present

## 2017-09-17 DIAGNOSIS — R61 Generalized hyperhidrosis: Secondary | ICD-10-CM | POA: Diagnosis not present

## 2017-09-17 DIAGNOSIS — R05 Cough: Secondary | ICD-10-CM | POA: Diagnosis not present

## 2017-09-17 DIAGNOSIS — R6883 Chills (without fever): Secondary | ICD-10-CM | POA: Diagnosis not present

## 2017-09-19 DIAGNOSIS — R197 Diarrhea, unspecified: Secondary | ICD-10-CM | POA: Diagnosis not present

## 2017-10-09 DIAGNOSIS — R61 Generalized hyperhidrosis: Secondary | ICD-10-CM | POA: Diagnosis not present

## 2017-10-09 DIAGNOSIS — R6883 Chills (without fever): Secondary | ICD-10-CM | POA: Diagnosis not present

## 2017-10-09 DIAGNOSIS — M791 Myalgia, unspecified site: Secondary | ICD-10-CM | POA: Diagnosis not present

## 2017-10-09 DIAGNOSIS — R05 Cough: Secondary | ICD-10-CM | POA: Diagnosis not present

## 2017-10-09 DIAGNOSIS — R899 Unspecified abnormal finding in specimens from other organs, systems and tissues: Secondary | ICD-10-CM | POA: Diagnosis not present

## 2017-11-07 DIAGNOSIS — M545 Low back pain: Secondary | ICD-10-CM | POA: Diagnosis not present

## 2017-11-13 DIAGNOSIS — M545 Low back pain: Secondary | ICD-10-CM | POA: Diagnosis not present

## 2017-11-15 DIAGNOSIS — M545 Low back pain: Secondary | ICD-10-CM | POA: Diagnosis not present

## 2017-11-20 DIAGNOSIS — M545 Low back pain: Secondary | ICD-10-CM | POA: Diagnosis not present

## 2017-11-29 DIAGNOSIS — M545 Low back pain: Secondary | ICD-10-CM | POA: Diagnosis not present

## 2018-02-14 DIAGNOSIS — E7800 Pure hypercholesterolemia, unspecified: Secondary | ICD-10-CM | POA: Diagnosis not present

## 2018-02-14 DIAGNOSIS — K219 Gastro-esophageal reflux disease without esophagitis: Secondary | ICD-10-CM | POA: Diagnosis not present

## 2018-02-14 DIAGNOSIS — I10 Essential (primary) hypertension: Secondary | ICD-10-CM | POA: Diagnosis not present

## 2018-02-14 DIAGNOSIS — N529 Male erectile dysfunction, unspecified: Secondary | ICD-10-CM | POA: Diagnosis not present

## 2018-04-02 DIAGNOSIS — L718 Other rosacea: Secondary | ICD-10-CM | POA: Diagnosis not present

## 2018-04-02 DIAGNOSIS — L858 Other specified epidermal thickening: Secondary | ICD-10-CM | POA: Diagnosis not present

## 2018-04-02 DIAGNOSIS — D225 Melanocytic nevi of trunk: Secondary | ICD-10-CM | POA: Diagnosis not present

## 2018-04-02 DIAGNOSIS — L821 Other seborrheic keratosis: Secondary | ICD-10-CM | POA: Diagnosis not present

## 2018-04-02 DIAGNOSIS — L570 Actinic keratosis: Secondary | ICD-10-CM | POA: Diagnosis not present

## 2018-04-19 DIAGNOSIS — I10 Essential (primary) hypertension: Secondary | ICD-10-CM | POA: Diagnosis not present

## 2018-04-19 DIAGNOSIS — E7800 Pure hypercholesterolemia, unspecified: Secondary | ICD-10-CM | POA: Diagnosis not present

## 2018-04-22 DIAGNOSIS — K219 Gastro-esophageal reflux disease without esophagitis: Secondary | ICD-10-CM | POA: Diagnosis not present

## 2018-04-22 DIAGNOSIS — N529 Male erectile dysfunction, unspecified: Secondary | ICD-10-CM | POA: Diagnosis not present

## 2018-04-22 DIAGNOSIS — I10 Essential (primary) hypertension: Secondary | ICD-10-CM | POA: Diagnosis not present

## 2018-04-22 DIAGNOSIS — E7800 Pure hypercholesterolemia, unspecified: Secondary | ICD-10-CM | POA: Diagnosis not present

## 2018-04-25 ENCOUNTER — Encounter: Payer: Self-pay | Admitting: Internal Medicine

## 2018-07-30 DIAGNOSIS — L570 Actinic keratosis: Secondary | ICD-10-CM | POA: Diagnosis not present

## 2018-07-30 DIAGNOSIS — D485 Neoplasm of uncertain behavior of skin: Secondary | ICD-10-CM | POA: Diagnosis not present

## 2018-07-30 DIAGNOSIS — D1801 Hemangioma of skin and subcutaneous tissue: Secondary | ICD-10-CM | POA: Diagnosis not present

## 2018-08-19 DIAGNOSIS — E7800 Pure hypercholesterolemia, unspecified: Secondary | ICD-10-CM | POA: Diagnosis not present

## 2018-08-19 DIAGNOSIS — K219 Gastro-esophageal reflux disease without esophagitis: Secondary | ICD-10-CM | POA: Diagnosis not present

## 2018-08-19 DIAGNOSIS — N529 Male erectile dysfunction, unspecified: Secondary | ICD-10-CM | POA: Diagnosis not present

## 2018-08-19 DIAGNOSIS — I10 Essential (primary) hypertension: Secondary | ICD-10-CM | POA: Diagnosis not present

## 2018-08-20 ENCOUNTER — Ambulatory Visit: Payer: BC Managed Care – PPO | Admitting: *Deleted

## 2018-08-20 ENCOUNTER — Other Ambulatory Visit: Payer: Self-pay

## 2018-08-20 VITALS — Ht 70.0 in | Wt 195.0 lb

## 2018-08-20 DIAGNOSIS — Z86010 Personal history of colonic polyps: Secondary | ICD-10-CM

## 2018-08-20 MED ORDER — NA SULFATE-K SULFATE-MG SULF 17.5-3.13-1.6 GM/177ML PO SOLN: 1.0000 | Freq: Once | ORAL | 0 refills | Status: AC

## 2018-08-20 NOTE — Progress Notes (Signed)

## 2018-08-26 ENCOUNTER — Other Ambulatory Visit: Payer: Self-pay

## 2018-08-26 DIAGNOSIS — Z20822 Contact with and (suspected) exposure to covid-19: Secondary | ICD-10-CM

## 2018-08-29 LAB — NOVEL CORONAVIRUS, NAA: SARS-CoV-2, NAA: NOT DETECTED

## 2018-09-03 ENCOUNTER — Encounter: Payer: Self-pay | Admitting: Internal Medicine

## 2018-12-19 DIAGNOSIS — K219 Gastro-esophageal reflux disease without esophagitis: Secondary | ICD-10-CM | POA: Diagnosis not present

## 2018-12-19 DIAGNOSIS — E7800 Pure hypercholesterolemia, unspecified: Secondary | ICD-10-CM | POA: Diagnosis not present

## 2018-12-19 DIAGNOSIS — N529 Male erectile dysfunction, unspecified: Secondary | ICD-10-CM | POA: Diagnosis not present

## 2018-12-19 DIAGNOSIS — I10 Essential (primary) hypertension: Secondary | ICD-10-CM | POA: Diagnosis not present

## 2019-03-21 DIAGNOSIS — Z1152 Encounter for screening for COVID-19: Secondary | ICD-10-CM | POA: Diagnosis not present

## 2019-03-31 ENCOUNTER — Ambulatory Visit: Payer: BC Managed Care – PPO | Attending: Internal Medicine

## 2019-03-31 DIAGNOSIS — Z20822 Contact with and (suspected) exposure to covid-19: Secondary | ICD-10-CM | POA: Diagnosis not present

## 2019-04-01 LAB — NOVEL CORONAVIRUS, NAA: SARS-CoV-2, NAA: DETECTED — AB

## 2019-05-21 DIAGNOSIS — D224 Melanocytic nevi of scalp and neck: Secondary | ICD-10-CM | POA: Diagnosis not present

## 2019-05-21 DIAGNOSIS — D2261 Melanocytic nevi of right upper limb, including shoulder: Secondary | ICD-10-CM | POA: Diagnosis not present

## 2019-05-21 DIAGNOSIS — L988 Other specified disorders of the skin and subcutaneous tissue: Secondary | ICD-10-CM | POA: Diagnosis not present

## 2019-05-21 DIAGNOSIS — D485 Neoplasm of uncertain behavior of skin: Secondary | ICD-10-CM | POA: Diagnosis not present

## 2019-05-21 DIAGNOSIS — D2239 Melanocytic nevi of other parts of face: Secondary | ICD-10-CM | POA: Diagnosis not present

## 2019-05-21 DIAGNOSIS — D2262 Melanocytic nevi of left upper limb, including shoulder: Secondary | ICD-10-CM | POA: Diagnosis not present

## 2019-05-27 DIAGNOSIS — Z8249 Family history of ischemic heart disease and other diseases of the circulatory system: Secondary | ICD-10-CM | POA: Diagnosis not present

## 2019-05-27 DIAGNOSIS — R519 Headache, unspecified: Secondary | ICD-10-CM | POA: Diagnosis not present

## 2019-05-27 DIAGNOSIS — G4482 Headache associated with sexual activity: Secondary | ICD-10-CM | POA: Diagnosis not present

## 2019-05-27 DIAGNOSIS — U071 COVID-19: Secondary | ICD-10-CM | POA: Diagnosis not present

## 2019-05-28 ENCOUNTER — Other Ambulatory Visit (HOSPITAL_BASED_OUTPATIENT_CLINIC_OR_DEPARTMENT_OTHER): Payer: Self-pay | Admitting: Family Medicine

## 2019-05-28 DIAGNOSIS — G4482 Headache associated with sexual activity: Secondary | ICD-10-CM

## 2019-06-26 DIAGNOSIS — R0789 Other chest pain: Secondary | ICD-10-CM | POA: Diagnosis not present

## 2019-06-26 DIAGNOSIS — W19XXXA Unspecified fall, initial encounter: Secondary | ICD-10-CM | POA: Diagnosis not present

## 2019-06-26 DIAGNOSIS — S20219A Contusion of unspecified front wall of thorax, initial encounter: Secondary | ICD-10-CM | POA: Diagnosis not present

## 2019-07-03 ENCOUNTER — Ambulatory Visit
Admission: RE | Admit: 2019-07-03 | Discharge: 2019-07-03 | Disposition: A | Discharge status: Home / Self Care | Payer: BC Managed Care – PPO | Source: Ambulatory Visit | Attending: Family Medicine | Admitting: Family Medicine

## 2019-07-03 ENCOUNTER — Other Ambulatory Visit: Payer: Self-pay

## 2019-07-03 DIAGNOSIS — G4482 Headache associated with sexual activity: Secondary | ICD-10-CM

## 2019-07-03 MED ORDER — GADOBENATE DIMEGLUMINE 529 MG/ML IV SOLN
18.0000 mL | Freq: Once | INTRAVENOUS | Status: AC | PRN
  Administered 2019-07-03: 18 mL via INTRAVENOUS

## 2019-07-14 DIAGNOSIS — Z8781 Personal history of (healed) traumatic fracture: Secondary | ICD-10-CM | POA: Diagnosis not present

## 2019-07-14 DIAGNOSIS — G930 Cerebral cysts: Secondary | ICD-10-CM | POA: Diagnosis not present

## 2019-07-14 DIAGNOSIS — M25571 Pain in right ankle and joints of right foot: Secondary | ICD-10-CM | POA: Diagnosis not present

## 2019-07-21 DIAGNOSIS — D485 Neoplasm of uncertain behavior of skin: Secondary | ICD-10-CM | POA: Diagnosis not present

## 2019-07-21 DIAGNOSIS — L988 Other specified disorders of the skin and subcutaneous tissue: Secondary | ICD-10-CM | POA: Diagnosis not present

## 2019-07-22 DIAGNOSIS — G930 Cerebral cysts: Secondary | ICD-10-CM | POA: Diagnosis not present

## 2019-07-22 DIAGNOSIS — I10 Essential (primary) hypertension: Secondary | ICD-10-CM | POA: Diagnosis not present

## 2019-07-22 DIAGNOSIS — Z6828 Body mass index (BMI) 28.0-28.9, adult: Secondary | ICD-10-CM | POA: Diagnosis not present

## 2019-09-22 DIAGNOSIS — Z20822 Contact with and (suspected) exposure to covid-19: Secondary | ICD-10-CM | POA: Diagnosis not present

## 2019-10-28 DIAGNOSIS — R195 Other fecal abnormalities: Secondary | ICD-10-CM | POA: Diagnosis not present

## 2019-10-28 DIAGNOSIS — K641 Second degree hemorrhoids: Secondary | ICD-10-CM | POA: Diagnosis not present

## 2019-10-29 DIAGNOSIS — R739 Hyperglycemia, unspecified: Secondary | ICD-10-CM | POA: Diagnosis not present

## 2019-10-29 DIAGNOSIS — M25552 Pain in left hip: Secondary | ICD-10-CM | POA: Diagnosis not present

## 2019-10-29 DIAGNOSIS — E7800 Pure hypercholesterolemia, unspecified: Secondary | ICD-10-CM | POA: Diagnosis not present

## 2019-10-29 DIAGNOSIS — R195 Other fecal abnormalities: Secondary | ICD-10-CM | POA: Diagnosis not present

## 2019-11-12 ENCOUNTER — Other Ambulatory Visit: Payer: BC Managed Care – PPO

## 2019-11-12 DIAGNOSIS — Z20822 Contact with and (suspected) exposure to covid-19: Secondary | ICD-10-CM | POA: Diagnosis not present

## 2019-11-13 LAB — NOVEL CORONAVIRUS, NAA: SARS-CoV-2, NAA: NOT DETECTED

## 2019-11-13 LAB — SARS-COV-2, NAA 2 DAY TAT

## 2020-05-10 ENCOUNTER — Encounter: Payer: Self-pay | Admitting: Internal Medicine

## 2020-07-05 IMAGING — MR MR MRA HEAD W/O CM
1 series · 22 of 48 positions shown · IV contrast (multihance)
Comparison: None.

CLINICAL DATA: Coital headache; left sided recurrent headache;
family history (sister) with cerebral aneurysm.

EXAM:
MRI HEAD WITHOUT AND WITH CONTRAST
MRA HEAD WITHOUT CONTRAST
TECHNIQUE: Multiplanar, multiecho pulse sequences of the brain and surrounding
structures were obtained without and with intravenous contrast.
Angiographic images of the head were obtained using MRA technique
without contrast.
CONTRAST:  18mL MULTIHANCE GADOBENATE DIMEGLUMINE 529 MG/ML IV SOLN

[Series 2: tof_3d_multi-slab · axial · 0.7mm · 0.35mm/px · z∈[-38,+60]mm · 22 of 149 slices shown]
[im 1/149]
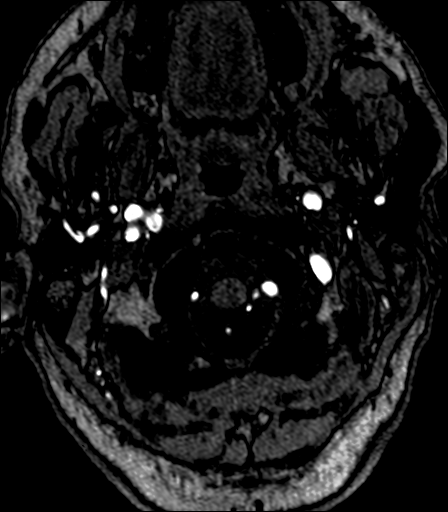
[im 4/149]
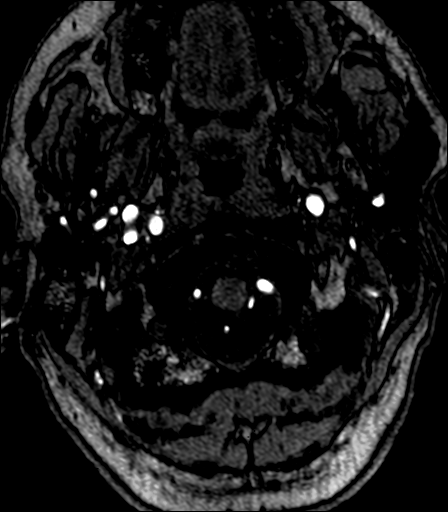
[im 7/149]
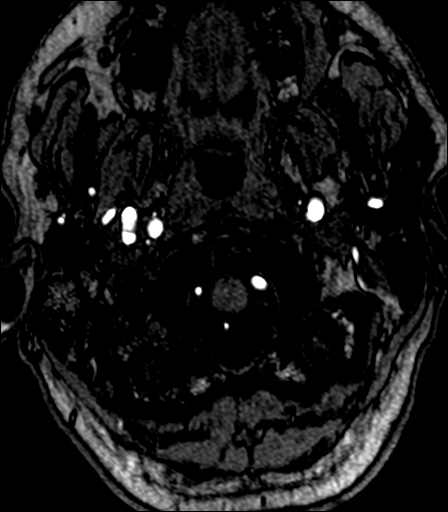
[im 10/149]
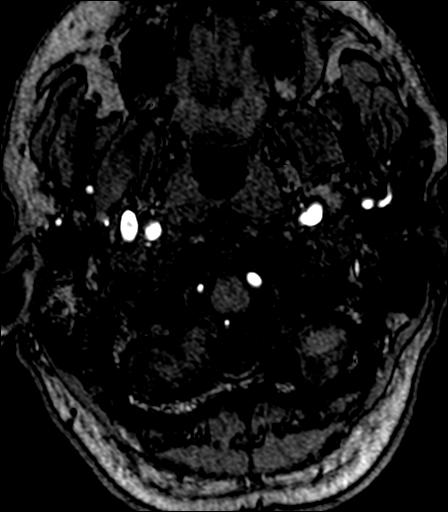
[im 13/149]
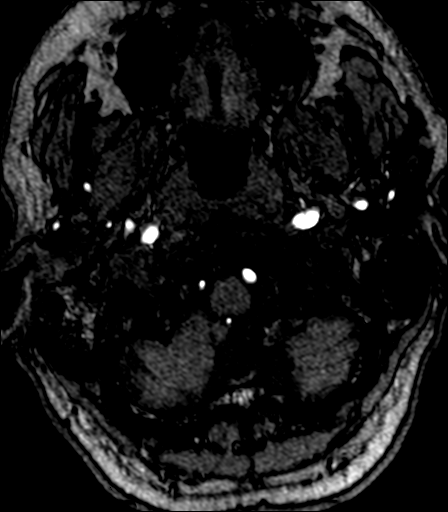
[im 16/149]
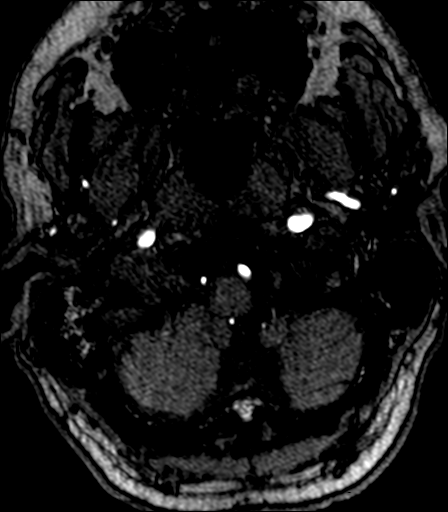
[im 19/149]
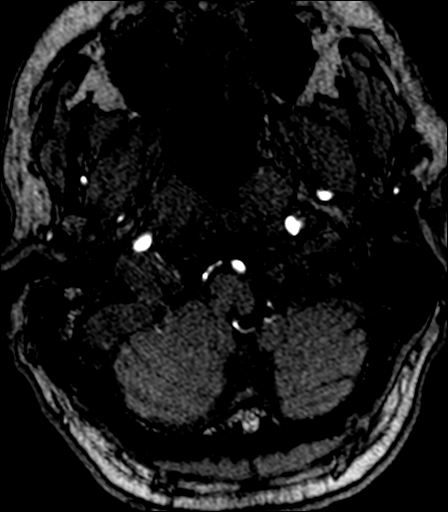
[im 23/149]
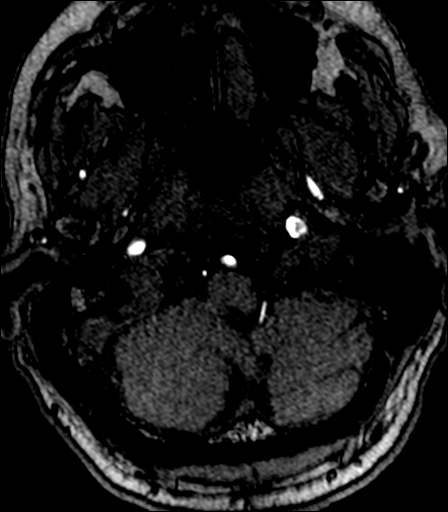
[im 26/149]
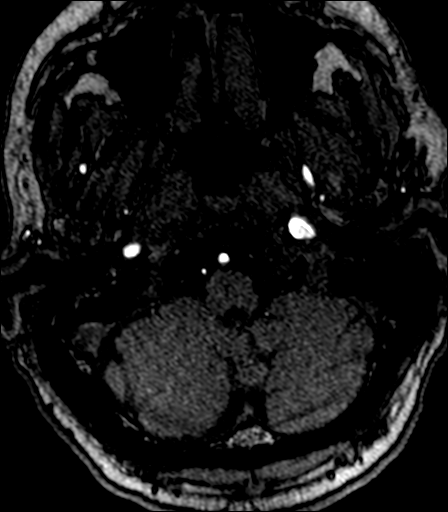
[im 29/149]
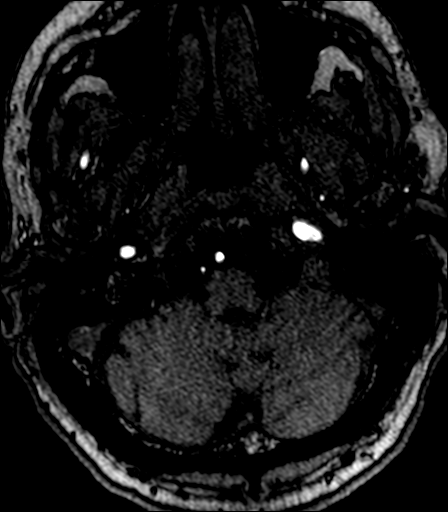
[im 32/149]
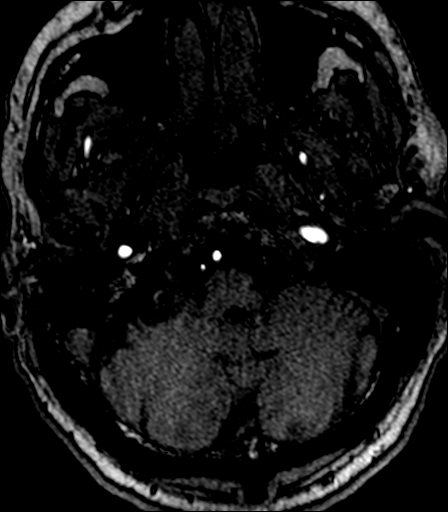
[im 35/149]
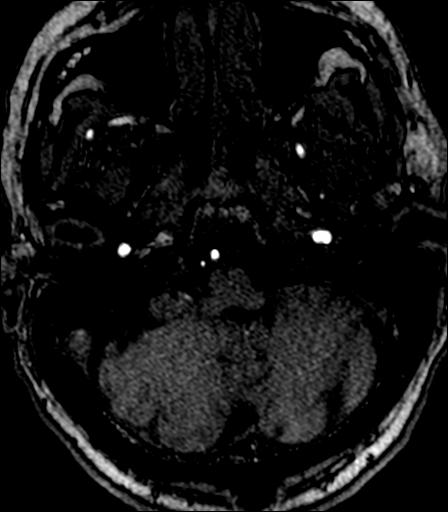
[im 38/149]
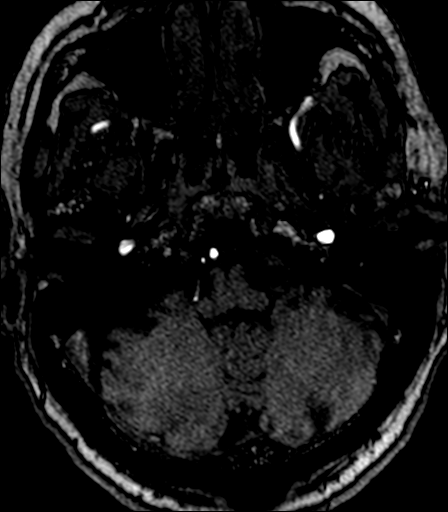
[im 41/149]
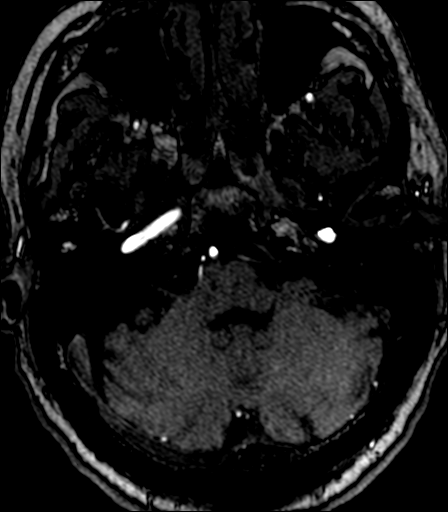
[im 48/149]
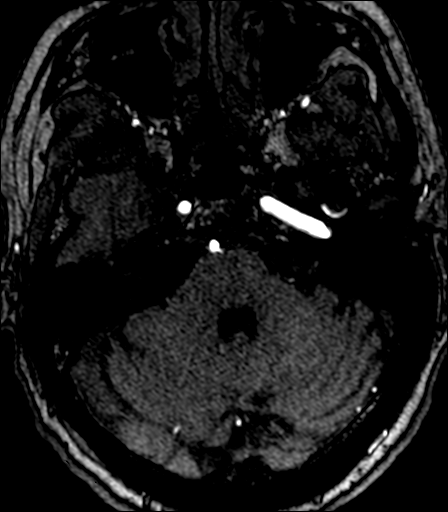
[im 67/149]
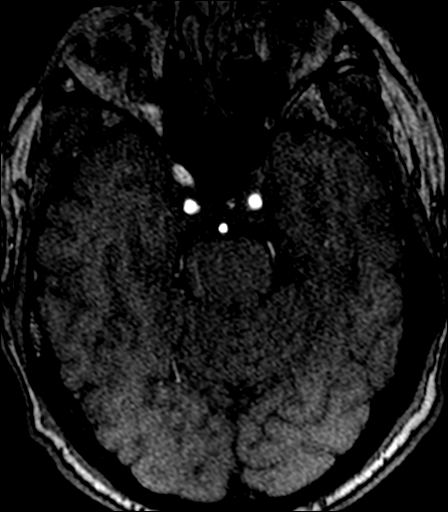
[im 76/149]
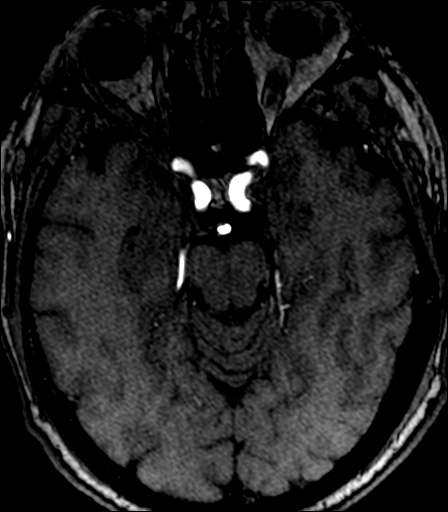
[im 86/149]
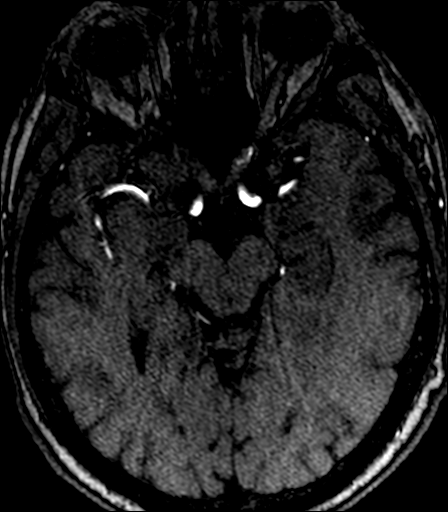
[im 104/149]
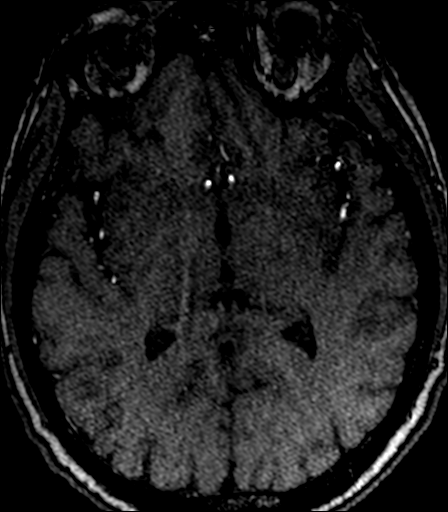
[im 123/149]
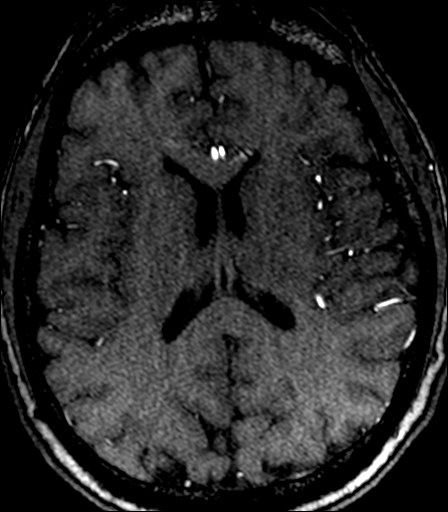
[im 126/149]
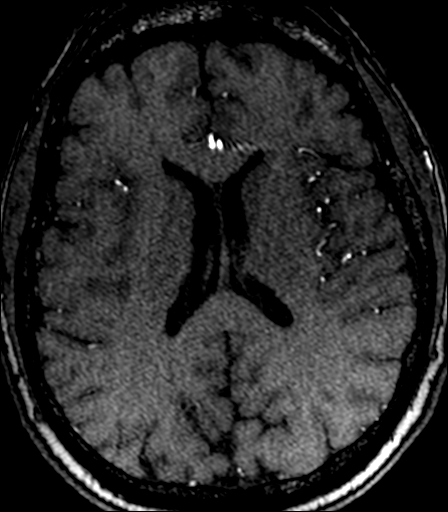
[im 142/149]
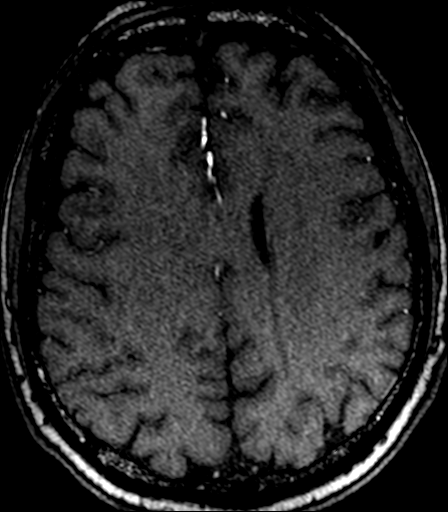

[22 of 48 positions shown; findings below may reference images not displayed]

FINDINGS: MRI HEAD FINDINGS

Brain: No acute infarction, hemorrhage or hydrocephalus. There is an
extra-axial lesion in the right anterior frontal region with signal
characteristics similar to CSF in all sequences and no evidence of
contrast enhancement. The lesion measures approximately 4.2 x 1.9 x
1.8 cm. There is mild scalloping of the inner table and mild
posterior displacement of the right frontal lobe, without
parenchymal edema or midline shift.

Vascular: Normal flow voids.

Skull and upper cervical spine: Normal marrow signal.

Sinuses/Orbits: Negative.

MRA HEAD FINDINGS

The visualized portions of the distal cervical and intracranial
internal carotid arteries are widely patent with normal flow related
enhancement. The bilateral anterior cerebral arteries and middle
cerebral arteries are widely patent with antegrade flow without
high-grade flow-limiting stenosis or proximal branch occlusion. No
intracranial aneurysm within the anterior circulation.

The vertebral arteries are patent with antegrade flow. The right
vertebral artery is hypoplastic with most of the arterial flow from
the right vertebral artery supplying the right PICA. The posterior
inferior cerebral arteries are normal. Vertebrobasilar junction and
basilar artery are widely patent with antegrade flow without
evidence of basilar stenosis or aneurysm. Posterior cerebral
arteries are normal bilaterally. No intracranial aneurysm within the
posterior circulation.
IMPRESSION: 1. No acute intracranial abnormality.
2. No brain aneurysm identified.
3. Right anterior frontal extra-axial lesion consistent with an
arachnoid cyst.

## 2020-07-22 ENCOUNTER — Ambulatory Visit (AMBULATORY_SURGERY_CENTER): Payer: Medicare Other

## 2020-07-22 ENCOUNTER — Other Ambulatory Visit: Payer: Self-pay

## 2020-07-22 VITALS — Ht 70.0 in | Wt 200.0 lb

## 2020-07-22 DIAGNOSIS — Z86010 Personal history of colonic polyps: Secondary | ICD-10-CM

## 2020-07-22 MED ORDER — NA SULFATE-K SULFATE-MG SULF 17.5-3.13-1.6 GM/177ML PO SOLN: 1.0000 | Freq: Once | ORAL | 0 refills | Status: AC

## 2020-07-22 NOTE — Progress Notes (Signed)
Pre visit completed via phone call; patient verified name, DOB, and address;  No egg or soy allergy known to patient  No issues with past sedation with any surgeries or procedures Patient denies ever being told they had issues or difficulty with intubation  No FH of Malignant Hyperthermia No diet pills per patient No home 02 use per patient  No blood thinners per patient  Pt denies issues with constipation  No A fib or A flutter  EMMI video via MyChart  COVID 19 guidelines implemented in PV today with Pt and RN   NO PA's for preps discussed with pt in PV today  Discussed with pt there will be an out-of-pocket cost for prep and that varies from $0 to 70 dollars   Due to the COVID-19 pandemic we are asking patients to follow certain guidelines.  Pt aware of COVID protocols and [facility] guidelines

## 2020-08-05 ENCOUNTER — Encounter: Payer: Self-pay | Admitting: Internal Medicine

## 2020-08-05 ENCOUNTER — Other Ambulatory Visit: Payer: Self-pay

## 2020-08-05 ENCOUNTER — Ambulatory Visit (AMBULATORY_SURGERY_CENTER): Payer: Medicare Other | Admitting: Internal Medicine

## 2020-08-05 VITALS — BP 119/71 | HR 80 | Temp 98.0°F | Resp 16 | Ht 70.0 in | Wt 200.0 lb

## 2020-08-05 DIAGNOSIS — D120 Benign neoplasm of cecum: Secondary | ICD-10-CM

## 2020-08-05 DIAGNOSIS — D124 Benign neoplasm of descending colon: Secondary | ICD-10-CM | POA: Diagnosis not present

## 2020-08-05 DIAGNOSIS — Z86010 Personal history of colonic polyps: Secondary | ICD-10-CM | POA: Diagnosis not present

## 2020-08-05 DIAGNOSIS — D122 Benign neoplasm of ascending colon: Secondary | ICD-10-CM

## 2020-08-05 MED ORDER — SODIUM CHLORIDE 0.9 % IV SOLN: 500.0000 mL | INTRAVENOUS | Status: DC

## 2020-08-05 NOTE — Progress Notes (Signed)
Called to room to assist during endoscopic procedure.  Patient ID and intended procedure confirmed with present staff. Received instructions for my participation in the procedure from the performing physician.  

## 2020-08-05 NOTE — Patient Instructions (Signed)
YOU HAD AN ENDOSCOPIC PROCEDURE TODAY AT THE [facility]:   Refer to the procedure report that was given to you for any specific questions about what was found during the examination.  If the procedure report does not answer your questions, please call your gastroenterologist to clarify.  If you requested that your care partner not be given the details of your procedure findings, then the procedure report has been included in a sealed envelope for you to review at your convenience later.  YOU SHOULD EXPECT: Some feelings of bloating in the abdomen. Passage of more gas than usual.  Walking can help get rid of the air that was put into your GI tract during the procedure and reduce the bloating. If you had a lower endoscopy (such as a colonoscopy or flexible sigmoidoscopy) you may notice spotting of blood in your stool or on the toilet paper. If you underwent a bowel prep for your procedure, you may not have a normal bowel movement for a few days.  Please Note:  You might notice some irritation and congestion in your nose or some drainage.  This is from the oxygen used during your procedure.  There is no need for concern and it should clear up in a day or so.  SYMPTOMS TO REPORT IMMEDIATELY:   Following lower endoscopy (colonoscopy or flexible sigmoidoscopy):  Excessive amounts of blood in the stool  Significant tenderness or worsening of abdominal pains  Swelling of the abdomen that is new, acute  Fever of 100F or higher   Following upper endoscopy (EGD)  Vomiting of blood or coffee ground material  New chest pain or pain under the shoulder blades  Painful or persistently difficult swallowing  New shortness of breath  Fever of 100F or higher  Black, tarry-looking stools  For urgent or emergent issues, a gastroenterologist can be reached at any hour by calling [phone]. Do not use MyChart messaging for urgent concerns.    DIET:  We do recommend a small meal at first, but  then you may proceed to your regular diet.  Drink plenty of fluids but you should avoid alcoholic beverages for 24 hours.  ACTIVITY:  You should plan to take it easy for the rest of today and you should NOT DRIVE or use heavy machinery until tomorrow (because of the sedation medicines used during the test).    FOLLOW UP: Our staff will call the number listed on your records 48-72 hours following your procedure to check on you and address any questions or concerns that you may have regarding the information given to you following your procedure. If we do not reach you, we will leave a message.  We will attempt to reach you two times.  During this call, we will ask if you have developed any symptoms of COVID 19. If you develop any symptoms (ie: fever, flu-like symptoms, shortness of breath, cough etc.) before then, please call [phone].  If you test positive for Covid 19 in the 2 weeks post procedure, please call and report this information to us.    If any biopsies were taken you will be contacted by phone or by letter within the next 1-3 weeks.  Please call us at [phone] if you have not heard about the biopsies in 3 weeks.    SIGNATURES/CONFIDENTIALITY: You and/or your care partner have signed paperwork which will be entered into your electronic medical record.  These signatures attest to the fact that that the information above on   your After Visit Summary has been reviewed and is understood.  Full responsibility of the confidentiality of this discharge information lies with you and/or your care-partner. 

## 2020-08-05 NOTE — Op Note (Signed)
[facility] Patient Name: Shown Dissinger Procedure Date: 08/05/2020 9:03 AM MRN: [id number] Endoscopist: Docia Chuck. Henrene Pastor , MD Age: 65 Referring MD:  Date of Birth: Aug 23, 1955 Gender: Male Account #: [id number] Procedure:                Colonoscopy with cold snare polypectomy x 5 Indications:              High risk colon cancer surveillance: Personal                            history of non-advanced adenomas, High risk colon                            cancer surveillance: Personal history of sessile                            serrated colon polyp (less than 10 mm in size) with                            no dysplasia. Previous examinations 2008                            (elsewhere); 2015 Medicines:                Monitored Anesthesia Care Procedure:                Pre-Anesthesia Assessment:                           - Prior to the procedure, a History and Physical                            was performed, and patient medications and                            allergies were reviewed. The patient's tolerance of                            previous anesthesia was also reviewed. The risks                            and benefits of the procedure and the sedation                            options and risks were discussed with the patient.                            All questions were answered, and informed consent                            was obtained. Prior Anticoagulants: The patient has                            taken no previous anticoagulant or antiplatelet  agents. ASA Grade Assessment: II - A patient with                            mild systemic disease. After reviewing the risks                            and benefits, the patient was deemed in                            satisfactory condition to undergo the procedure.                           After obtaining informed consent, the colonoscope                            was passed under direct  vision. Throughout the                            procedure, the patient's blood pressure, pulse, and                            oxygen saturations were monitored continuously. The                            Olympus CF-HQ190L [id number]) Colonoscope was                            introduced through the anus and advanced to the the                            cecum, identified by appendiceal orifice and                            ileocecal valve. The ileocecal valve, appendiceal                            orifice, and rectum were photographed. The quality                            of the bowel preparation was excellent. The                            colonoscopy was performed without difficulty. The                            patient tolerated the procedure well. The bowel                            preparation used was SUPREP via split dose                            instruction. Scope In: 9:15:06 AM Scope Out: 9:35:11 AM Scope Withdrawal Time: 0 hours 18 minutes 42 seconds  Total Procedure Duration: 0 hours 20 minutes  5 seconds  Findings:                 Five polyps were found in the descending colon,                            ascending colon and cecum. The polyps were 1 to 4                            mm in size. These polyps were removed with a cold                            snare. Resection and retrieval were complete.                           The exam was otherwise without abnormality on                            direct and retroflexion views. Complications:            No immediate complications. Estimated blood loss:                            None. Estimated Blood Loss:     Estimated blood loss: none. Impression:               - Five 1 to 4 mm polyps in the descending colon, in                            the ascending colon and in the cecum, removed with                            a cold snare. Resected and retrieved.                           - The examination was otherwise  normal on direct                            and retroflexion views. Recommendation:           - Repeat colonoscopy in 3 - 5 years for                            surveillance.                           - Patient has a contact number available for                            emergencies. The signs and symptoms of potential                            delayed complications were discussed with the                            patient. Return to normal activities tomorrow.  Written discharge instructions were provided to the                            patient.                           - Resume previous diet.                           - Continue present medications.                           - Await pathology results. Docia Chuck. Henrene Pastor, MD 08/05/2020 9:41:59 AM This report has been signed electronically.

## 2020-08-05 NOTE — Progress Notes (Signed)
PT taken to PACU. Monitors in place. VSS. Report given to RN. 

## 2020-08-10 ENCOUNTER — Telehealth: Payer: Self-pay

## 2020-08-10 NOTE — Telephone Encounter (Signed)
  Follow up Call-  Call back number 08/05/2020  Post procedure Call Back phone  # [phone]  Permission to leave phone message Yes  Some recent data might be hidden     Patient questions:  Do you have a fever, pain , or abdominal swelling? No. Pain Score  0 *  Have you tolerated food without any problems? Yes.    Have you been able to return to your normal activities? Yes.    Do you have any questions about your discharge instructions: Diet   No. Medications  No. Follow up visit  No.  Do you have questions or concerns about your Care? No.  Actions: * If pain score is 4 or above: No action needed, pain <4.  Have you developed a fever since your procedure? no  2.   Have you had an respiratory symptoms (SOB or cough) since your procedure? no  3.   Have you tested positive for COVID 19 since your procedure no  4.   Have you had any family members/close contacts diagnosed with the COVID 19 since your procedure?  no   If yes to any of these questions please route to Joylene John, RN and Joella Prince, RN

## 2020-08-11 ENCOUNTER — Encounter: Payer: Self-pay | Admitting: Internal Medicine

## 2020-11-01 DIAGNOSIS — Z125 Encounter for screening for malignant neoplasm of prostate: Secondary | ICD-10-CM | POA: Diagnosis not present

## 2020-11-01 DIAGNOSIS — N529 Male erectile dysfunction, unspecified: Secondary | ICD-10-CM | POA: Diagnosis not present

## 2020-11-01 DIAGNOSIS — I10 Essential (primary) hypertension: Secondary | ICD-10-CM | POA: Diagnosis not present

## 2020-11-01 DIAGNOSIS — R591 Generalized enlarged lymph nodes: Secondary | ICD-10-CM | POA: Diagnosis not present

## 2020-11-01 DIAGNOSIS — R202 Paresthesia of skin: Secondary | ICD-10-CM | POA: Diagnosis not present

## 2020-11-01 DIAGNOSIS — Z23 Encounter for immunization: Secondary | ICD-10-CM | POA: Diagnosis not present

## 2020-11-01 DIAGNOSIS — Z0000 Encounter for general adult medical examination without abnormal findings: Secondary | ICD-10-CM | POA: Diagnosis not present

## 2020-11-01 DIAGNOSIS — D126 Benign neoplasm of colon, unspecified: Secondary | ICD-10-CM | POA: Diagnosis not present

## 2020-11-01 DIAGNOSIS — Z8371 Family history of colonic polyps: Secondary | ICD-10-CM | POA: Diagnosis not present

## 2020-11-01 DIAGNOSIS — M898X1 Other specified disorders of bone, shoulder: Secondary | ICD-10-CM | POA: Diagnosis not present

## 2020-11-01 DIAGNOSIS — M79672 Pain in left foot: Secondary | ICD-10-CM | POA: Diagnosis not present

## 2020-11-01 DIAGNOSIS — E7800 Pure hypercholesterolemia, unspecified: Secondary | ICD-10-CM | POA: Diagnosis not present

## 2020-11-02 DIAGNOSIS — D1801 Hemangioma of skin and subcutaneous tissue: Secondary | ICD-10-CM | POA: Diagnosis not present

## 2020-11-02 DIAGNOSIS — L821 Other seborrheic keratosis: Secondary | ICD-10-CM | POA: Diagnosis not present

## 2020-11-02 DIAGNOSIS — L570 Actinic keratosis: Secondary | ICD-10-CM | POA: Diagnosis not present

## 2020-11-02 DIAGNOSIS — L814 Other melanin hyperpigmentation: Secondary | ICD-10-CM | POA: Diagnosis not present

## 2020-11-09 DIAGNOSIS — R591 Generalized enlarged lymph nodes: Secondary | ICD-10-CM | POA: Diagnosis not present

## 2020-11-09 DIAGNOSIS — Z125 Encounter for screening for malignant neoplasm of prostate: Secondary | ICD-10-CM | POA: Diagnosis not present

## 2020-11-09 DIAGNOSIS — E7800 Pure hypercholesterolemia, unspecified: Secondary | ICD-10-CM | POA: Diagnosis not present

## 2020-11-09 DIAGNOSIS — R202 Paresthesia of skin: Secondary | ICD-10-CM | POA: Diagnosis not present

## 2020-11-16 DIAGNOSIS — M546 Pain in thoracic spine: Secondary | ICD-10-CM | POA: Diagnosis not present

## 2020-11-16 DIAGNOSIS — M79672 Pain in left foot: Secondary | ICD-10-CM | POA: Diagnosis not present

## 2021-02-22 DIAGNOSIS — M79604 Pain in right leg: Secondary | ICD-10-CM | POA: Diagnosis not present

## 2021-03-14 DIAGNOSIS — M25572 Pain in left ankle and joints of left foot: Secondary | ICD-10-CM | POA: Diagnosis not present

## 2021-03-14 DIAGNOSIS — R202 Paresthesia of skin: Secondary | ICD-10-CM | POA: Diagnosis not present

## 2021-03-14 DIAGNOSIS — G8929 Other chronic pain: Secondary | ICD-10-CM | POA: Diagnosis not present

## 2021-03-14 DIAGNOSIS — N529 Male erectile dysfunction, unspecified: Secondary | ICD-10-CM | POA: Diagnosis not present

## 2021-03-14 DIAGNOSIS — E7800 Pure hypercholesterolemia, unspecified: Secondary | ICD-10-CM | POA: Diagnosis not present

## 2021-03-14 DIAGNOSIS — M2550 Pain in unspecified joint: Secondary | ICD-10-CM | POA: Diagnosis not present

## 2021-03-14 DIAGNOSIS — R591 Generalized enlarged lymph nodes: Secondary | ICD-10-CM | POA: Diagnosis not present

## 2021-03-14 DIAGNOSIS — Z8371 Family history of colonic polyps: Secondary | ICD-10-CM | POA: Diagnosis not present

## 2021-03-14 DIAGNOSIS — D126 Benign neoplasm of colon, unspecified: Secondary | ICD-10-CM | POA: Diagnosis not present

## 2021-03-14 DIAGNOSIS — I10 Essential (primary) hypertension: Secondary | ICD-10-CM | POA: Diagnosis not present

## 2021-06-28 DIAGNOSIS — D224 Melanocytic nevi of scalp and neck: Secondary | ICD-10-CM | POA: Diagnosis not present

## 2021-06-28 DIAGNOSIS — D1801 Hemangioma of skin and subcutaneous tissue: Secondary | ICD-10-CM | POA: Diagnosis not present

## 2021-06-28 DIAGNOSIS — D2261 Melanocytic nevi of right upper limb, including shoulder: Secondary | ICD-10-CM | POA: Diagnosis not present

## 2021-06-28 DIAGNOSIS — L821 Other seborrheic keratosis: Secondary | ICD-10-CM | POA: Diagnosis not present

## 2021-06-28 DIAGNOSIS — D225 Melanocytic nevi of trunk: Secondary | ICD-10-CM | POA: Diagnosis not present

## 2021-06-28 DIAGNOSIS — L570 Actinic keratosis: Secondary | ICD-10-CM | POA: Diagnosis not present

## 2021-06-28 DIAGNOSIS — L858 Other specified epidermal thickening: Secondary | ICD-10-CM | POA: Diagnosis not present

## 2021-06-28 DIAGNOSIS — D2272 Melanocytic nevi of left lower limb, including hip: Secondary | ICD-10-CM | POA: Diagnosis not present

## 2021-06-28 DIAGNOSIS — D2262 Melanocytic nevi of left upper limb, including shoulder: Secondary | ICD-10-CM | POA: Diagnosis not present

## 2021-06-28 DIAGNOSIS — D2271 Melanocytic nevi of right lower limb, including hip: Secondary | ICD-10-CM | POA: Diagnosis not present

## 2021-08-15 DIAGNOSIS — H2513 Age-related nuclear cataract, bilateral: Secondary | ICD-10-CM | POA: Diagnosis not present

## 2022-04-04 ENCOUNTER — Encounter: Payer: Self-pay | Admitting: Family Medicine

## 2022-04-04 DIAGNOSIS — G930 Cerebral cysts: Secondary | ICD-10-CM | POA: Insufficient documentation

## 2022-04-05 ENCOUNTER — Ambulatory Visit (INDEPENDENT_AMBULATORY_CARE_PROVIDER_SITE_OTHER): Payer: Medicare Other | Admitting: Family Medicine

## 2022-04-05 ENCOUNTER — Encounter: Payer: Self-pay | Admitting: Family Medicine

## 2022-04-05 VITALS — BP 132/80 | HR 68 | Temp 97.3°F | Ht 69.5 in | Wt 199.4 lb

## 2022-04-05 DIAGNOSIS — I10 Essential (primary) hypertension: Secondary | ICD-10-CM

## 2022-04-05 DIAGNOSIS — R61 Generalized hyperhidrosis: Secondary | ICD-10-CM | POA: Diagnosis not present

## 2022-04-05 DIAGNOSIS — K219 Gastro-esophageal reflux disease without esophagitis: Secondary | ICD-10-CM | POA: Diagnosis not present

## 2022-04-05 DIAGNOSIS — E782 Mixed hyperlipidemia: Secondary | ICD-10-CM | POA: Diagnosis not present

## 2022-04-05 DIAGNOSIS — B009 Herpesviral infection, unspecified: Secondary | ICD-10-CM | POA: Insufficient documentation

## 2022-04-05 DIAGNOSIS — K529 Noninfective gastroenteritis and colitis, unspecified: Secondary | ICD-10-CM | POA: Diagnosis not present

## 2022-04-05 DIAGNOSIS — J3089 Other allergic rhinitis: Secondary | ICD-10-CM | POA: Insufficient documentation

## 2022-04-05 DIAGNOSIS — Z91018 Allergy to other foods: Secondary | ICD-10-CM | POA: Insufficient documentation

## 2022-04-05 DIAGNOSIS — N529 Male erectile dysfunction, unspecified: Secondary | ICD-10-CM | POA: Insufficient documentation

## 2022-04-05 DIAGNOSIS — M7021 Olecranon bursitis, right elbow: Secondary | ICD-10-CM | POA: Diagnosis not present

## 2022-04-05 DIAGNOSIS — M109 Gout, unspecified: Secondary | ICD-10-CM | POA: Diagnosis not present

## 2022-04-05 DIAGNOSIS — Z789 Other specified health status: Secondary | ICD-10-CM | POA: Insufficient documentation

## 2022-04-05 DIAGNOSIS — R197 Diarrhea, unspecified: Secondary | ICD-10-CM | POA: Insufficient documentation

## 2022-04-05 LAB — COMPREHENSIVE METABOLIC PANEL
ALT: 21 U/L (ref 0–53)
AST: 24 U/L (ref 0–37)
Albumin: 4.4 g/dL (ref 3.5–5.2)
Alkaline Phosphatase: 48 U/L (ref 39–117)
BUN: 25 mg/dL — ABNORMAL HIGH (ref 6–23)
CO2: 27 mEq/L (ref 19–32)
Calcium: 10 mg/dL (ref 8.4–10.5)
Chloride: 101 mEq/L (ref 96–112)
Creatinine, Ser: 1.08 mg/dL (ref 0.40–1.50)
GFR: 71.36 mL/min (ref >60.00)
Glucose, Bld: 103 mg/dL — ABNORMAL HIGH (ref 70–99)
Potassium: 4.9 mEq/L (ref 3.5–5.1)
Sodium: 135 mEq/L (ref 135–145)
Total Bilirubin: 0.7 mg/dL (ref 0.2–1.2)
Total Protein: 7.3 g/dL (ref 6.0–8.3)

## 2022-04-05 LAB — CBC WITH DIFFERENTIAL/PLATELET
Basophils Absolute: 0 10*3/uL (ref 0.0–0.1)
Basophils Relative: 0.6 % (ref 0.0–3.0)
Eosinophils Absolute: 0.1 10*3/uL (ref 0.0–0.7)
Eosinophils Relative: 1.1 % (ref 0.0–5.0)
HCT: 42.3 % (ref 39.0–52.0)
Hemoglobin: 14.9 g/dL (ref 13.0–17.0)
Lymphocytes Relative: 21.7 % (ref 12.0–46.0)
Lymphs Abs: 1.1 10*3/uL (ref 0.7–4.0)
MCHC: 35.2 g/dL (ref 30.0–36.0)
MCV: 89.3 fl (ref 78.0–100.0)
Monocytes Absolute: 0.4 10*3/uL (ref 0.1–1.0)
Monocytes Relative: 7.9 % (ref 3.0–12.0)
Neutro Abs: 3.3 10*3/uL (ref 1.4–7.7)
Neutrophils Relative %: 68.7 % (ref 43.0–77.0)
Platelets: 187 10*3/uL (ref 150.0–400.0)
RBC: 4.74 Mil/uL (ref 4.22–5.81)
RDW: 13.3 % (ref 11.5–15.5)
WBC: 4.8 10*3/uL (ref 4.0–10.5)

## 2022-04-05 LAB — LIPID PANEL
Cholesterol: 246 mg/dL — ABNORMAL HIGH (ref 0–200)
HDL: 57.3 mg/dL (ref >39.00)
LDL Cholesterol: 174 mg/dL — ABNORMAL HIGH (ref 0–99)
NonHDL: 188.45
Total CHOL/HDL Ratio: 4
Triglycerides: 72 mg/dL (ref 0.0–149.0)
VLDL: 14.4 mg/dL (ref 0.0–40.0)

## 2022-04-05 LAB — TSH: TSH: 1.16 u[IU]/mL (ref 0.35–5.50)

## 2022-04-05 LAB — URIC ACID: Uric Acid, Serum: 6.8 mg/dL (ref 4.0–7.8)

## 2022-04-05 LAB — T4, FREE: Free T4: 0.66 ng/dL (ref 0.60–1.60)

## 2022-04-05 MED ORDER — PANTOPRAZOLE SODIUM 40 MG PO TBEC: 40.0000 mg | DELAYED_RELEASE_TABLET | Freq: Every day | ORAL | 5 refills | Status: AC | PRN

## 2022-04-05 NOTE — Addendum Note (Signed)
Addended by: Haydee Salter on: 04/05/2022 05:04 PM   Modules accepted: Orders

## 2022-04-05 NOTE — Assessment & Plan Note (Signed)
I will initiate lab and stool studies to look for common causes of chronic diarrhea.

## 2022-04-05 NOTE — Assessment & Plan Note (Signed)
Stable on Protonix.

## 2022-04-05 NOTE — Assessment & Plan Note (Signed)
I will check his current lipid levels. He has had an intolerance of statins and ezetimibe. he has a friend on Repatha and would consider such an approach if indicated.

## 2022-04-05 NOTE — Assessment & Plan Note (Signed)
I will check a uric acid level to consider if he might need to use allopurinol daily.

## 2022-04-05 NOTE — Assessment & Plan Note (Signed)
As this is not currently painful, I recommend we observe this for resolution. If persistent, we could consider drainage.

## 2022-04-05 NOTE — Progress Notes (Signed)
[facility] [address] [place] [address] Dept: [phone] Dept Fax: [phone]  New Patient Office Visit  Subjective:    Patient ID: Clifford Gomez, male    DOB: May 16, 1955, 67 y.o..   MRN: [id number]  Chief Complaint  Patient presents with   Establish Care    NP-establish care.  Fasting today.  C/o having swelling on the RT elbow x 3 days and having diarrhea issues, sweating at night.    History of Present Illness:  Patient is in today to establish care. Clifford Gomez was born in [place], [place]. 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 He has a SO. He has no children of his own, but is guardian for a 67 yo girl. He denies use of tobacco or drugs and only occasionally drinks alcohol.   Clifford Gomez has a history of hypertension in the past. He had previously been on medication, but notes he was able to stop this with time.  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 he manages this with allopurinol as needed for attacks.  Clifford Gomez has a history of perennial allergies. He uses Zyrtec as needed. He sometimes uses Benadryl.  Clifford Gomez has a history of GERD. He manages this with pantoprazole 40 mg daily.  Clifford Gomez has a history of ED. He manages this with Cialis.  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 He takes valacyclovir for 2 days when he has outbreaks.  Clifford Gomez notes that a few days ago, he developed sudden onset of swelling over his right elbow. he denies any pain associated with this. He admits to resting his elbows regularly while he does work. He also notes a prominent bump near his left elbow. This is not necessarily painful. This occasionally bothers him when he plays golf.  Clifford Gomez notes a 78-monthhistory of chronic diarrhea.  He has not seen iny blood in  his stool. He ahs not had fever, but he does admit to intermittent issues with night sweats.  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   Intracranial arachnoid cyst 04/04/2022   Body mass index (BMI) 28.0-28.9, adult 07/22/2019   Essential (primary) hypertension 07/22/2019   Cervical radiculopathy 10/07/2013   Personal history of colonic polyps 04/16/2013   Melanocytic nevi of trunk 05/08/2012   Memory impairment 05/08/2012   Atypical chest pain 05/08/2012   Hyperlipidemia    Past Surgical History:  Procedure Laterality Date   ANKLE SURGERY Right    COLONOSCOPY  2015   JP-MAC-movi(exc)-TA   POLYPECTOMY     TONSILLECTOMY     WISDOM TOOTH EXTRACTION     Family History  Problem Relation Age of Onset   Hyperlipidemia Mother    Dementia Mother    Hypertension Father    Hyperlipidemia Father    Breast cancer Sister    Hypertension Sister    Hypertension Brother    Stroke Paternal Aunt    Cancer Maternal Grandmother    Diabetes Maternal Grandfather    Heart disease Maternal Grandfather    Alcohol  abuse Paternal Grandfather    Colon cancer Neg Hx    Stomach cancer Neg Hx    Colon polyps Neg Hx    Esophageal cancer Neg Hx    Rectal cancer Neg Hx    Outpatient Medications Prior to Visit  Medication Sig Dispense Refill   allopurinol (ZYLOPRIM) 300 MG tablet Take 300 mg by mouth daily.     cetirizine (ZYRTEC) 10 MG tablet Take 1 tablet by mouth daily as needed.     diphenhydrAMINE (BENADRYL) 25 mg capsule Take 1 capsule by mouth daily as needed.     IBUPROFEN IB PO Take 1 tablet by mouth daily as needed.     tadalafil (CIALIS) 20 MG tablet Take by mouth.      valACYclovir (VALTREX) 1000 MG tablet      pantoprazole (PROTONIX) 40 MG tablet Take 1 tablet by mouth daily as needed.     amLODipine (NORVASC) 5 MG tablet TK 1 T PO QD (Patient not taking: No sig reported)     cholecalciferol (VITAMIN D) 1000 UNITS tablet Take 1,000 Units by mouth as needed. (Patient not taking: Reported on 07/22/2020)     cyclobenzaprine (FLEXERIL) 10 MG tablet 1 tablet as needed for muscle relaxation     fish oil-omega-3 fatty acids 1000 MG capsule Take 2 g by mouth daily.   (Patient not taking: No sig reported)     rosuvastatin (CRESTOR) 10 MG tablet TK 1 T PO QD (Patient not taking: No sig reported)     Turmeric 500 MG CAPS Take by mouth daily. (Patient not taking: No sig reported)     No facility-administered medications prior to visit.   Allergies  Allergen Reactions   Ezetimibe Other (See Comments)   Pravastatin Other (See Comments)    Muscle cramps   Simvastatin Other (See Comments)    Muscle cramps   Objective:   Today's Vitals   04/05/22 0816  BP: 132/80  Pulse: 68  Temp: (!) 97.3 F (36.3 C)  TempSrc: Temporal  SpO2: 98%  Weight: 199 lb 6.4 oz (90.4 kg)  Height: 5' 9.5" (1.765 m)   Body mass index is 29.02 kg/m.   General: Well developed, well nourished. No acute distress. Psych: Alert and oriented. Normal mood and affect.  Health Maintenance Due  Topic Date Due   Medicare Annual Wellness (AWV)  Never done   Hepatitis C Screening  Never done   DTaP/Tdap/Td (1 - Tdap) Never done   Zoster Vaccines- Shingrix (1 of 2) Never done   Pneumonia Vaccine 79+ Years old (1 of 1 - PCV) Never done     Assessment & Plan:   Problem List Items Addressed This Visit       Cardiovascular and Mediastinum   Essential (primary) hypertension    Blood pressure is mildly elevated today. We will plan to monitor this and consider restarting his medication. I will check his renal function today.      Relevant Orders   Comprehensive metabolic panel      Digestive   Chronic diarrhea    I will initiate lab and stool studies to look for common causes of chronic diarrhea.      Relevant Orders   Comprehensive metabolic panel   CBC with Differential/Platelet   Giardia antigen   Celiac Panel   Calprotectin, Fecal   TSH   T4, free   GERD (gastroesophageal reflux disease)    Stable on Protonix.      Relevant Medications   pantoprazole (  PROTONIX) 40 MG tablet   Other Relevant Orders   Uric acid     Musculoskeletal and Integument   Olecranon bursitis of right elbow    As this is not currently painful, I recommend we observe this for resolution. If persistent, we could consider drainage.        Other   Hyperlipidemia - Primary    I will check his current lipid levels. He has had an intolerance of statins and ezetimibe. he has a friend on Repatha and would consider such an approach if indicated.      Relevant Orders   Lipid panel   Chronic night sweats    It is likely that this also relates to the issues of chronic diarrhea. He has no other symptoms to suggest an etiology.      Relevant Orders   TSH   T4, free   Gout    I will check a uric acid level to consider if he might need to use allopurinol daily.      Relevant Orders   Uric acid    Return in about 4 weeks (around 05/03/2022) for Reassessment.   Haydee Salter, MD

## 2022-04-05 NOTE — Assessment & Plan Note (Signed)
It is likely that this also relates to the issues of chronic diarrhea. He has no other symptoms to suggest an etiology.

## 2022-04-05 NOTE — Patient Instructions (Signed)
Elbow Bursitis  Elbow (olecranon) bursitis is the swelling of the fluid-filled sac (bursa) between the tip of your elbow and your skin. A bursa acts as a cushion and protects the joint. If the bursa becomes irritated, it can fill with extra fluid and become inflamed. This condition is also called olecranon bursitis. What are the causes? This condition may be caused by: An elbow injury, such as falling onto the elbow. Leaning on hard surfaces for long periods of time. Infection from an injury that breaks the skin near the elbow. A bone growth (spur) that forms at the tip of the elbow. A medical condition that causes inflammation, such as gout or rheumatoid arthritis. Sometimes the cause is not known. What are the signs or symptoms? The first sign of elbow bursitis is usually swelling at the tip of the elbow. This can grow to be about the size of a golf ball. Swelling may start suddenly or develop gradually. Other symptoms may include: Pain when bending or leaning on the elbow. Stiffness, or not being able to move the elbow normally. If bursitis is caused by an infection, you may have: Redness, warmth, and tenderness of the elbow. Drainage of pus from the swollen area over the elbow, if the skin breaks open. How is this diagnosed? This condition may be diagnosed based on: Your symptoms and medical history. Any recent injuries you have had. A physical exam. X-rays to check for a bone spur or fracture. Draining fluid from the bursa to test it for infection. Blood tests to rule out gout or rheumatoid arthritis. How is this treated? Treatment for this condition depends on the cause. Treatment may include: Medicines. These may include: Over-the-counter medicines to relieve pain and inflammation. Antibiotic medicines if there is an infection. Injections of anti-inflammatory medicines (steroids). Draining fluid from the bursa. Wrapping your elbow with a bandage or compression arm  sleeve. Wearing elbow pads. If these treatments do not help, you may need surgery to remove the bursa. Follow these instructions at home: Medicines Take over-the-counter and prescription medicines only as told by your health care provider. If you were prescribed an antibiotic medicine, take it as told by your health care provider. Do not stop taking the antibiotic even if you start to feel better. Managing pain, stiffness, and swelling     If directed, put ice on the affected area. To do this: Put ice in a plastic bag. Place a towel between your skin and the bag. Leave the ice on for 20 minutes, 2-3 times a day. Remove the ice if your skin turns bright red. This is very important. If you cannot feel pain, heat, or cold, you have a greater risk of damage to the area. If directed, apply heat to the affected area as often as told by your health care provider. Use the heat source that your health care provider recommends, such as a moist heat pack or a heating pad. Place a towel between your skin and the heat source. Leave the heat on 20-30 minutes. Remove the heat if your skin turns bright red. This is especially important if you are unable to feel pain, heat, or cold. You may have a greater risk of getting burned. If your bursitis is caused by an injury, rest your elbow and wear your bandage or sleeve as told by your health care provider. Use elbow pads or elbow wraps to cushion your elbow and control swelling as needed. General instructions Avoid any activities that cause elbow pain. Ask   your health care provider what activities are safe for you. Keep all follow-up visits. This is important. Contact a health care provider if: You have a fever. Your symptoms do not get better with treatment. You have pain or swelling that gets worse, or it goes away and then comes back. You have pus draining from your elbow. You have redness around the elbow area. Your elbow is warm to the touch. Get  help right away if: You have trouble moving your arm, hand, or fingers. Summary Elbow (olecranon) bursitis is the swelling of the fluid-filled sac (bursa) between the tip of your elbow and your skin. Treatment for elbow bursitis depends on the cause. It may include medicines to relieve pain and inflammation, antibiotic medicines to treat an infection, or draining fluid from your elbow. Contact a health care provider if your symptoms do not get better with treatment, or if your symptoms go away and then come back. This information is not intended to replace advice given to you by your health care provider. Make sure you discuss any questions you have with your health care provider. Document Revised: 01/18/2021 Document Reviewed: 01/18/2021 Elsevier Patient Education  2023 Elsevier Inc.  

## 2022-04-05 NOTE — Assessment & Plan Note (Signed)
Blood pressure is mildly elevated today. We will plan to monitor this and consider restarting his medication. I will check his renal function today.

## 2022-04-06 LAB — GLIA (IGA/G) + TTG IGA
Antigliadin Abs, IgA: 4 units (ref 0–19)
Gliadin IgG: 2 units (ref 0–19)
Transglutaminase IgA: <2 U/mL (ref 0–3)

## 2022-04-12 DIAGNOSIS — K529 Noninfective gastroenteritis and colitis, unspecified: Secondary | ICD-10-CM | POA: Diagnosis not present

## 2022-04-13 LAB — GIARDIA ANTIGEN
MICRO NUMBER:: 14657107
RESULT:: NOT DETECTED
SPECIMEN QUALITY:: ADEQUATE

## 2022-04-17 LAB — CALPROTECTIN, FECAL: Calprotectin, Fecal: 13 ug/g (ref 0–120)

## 2022-05-08 ENCOUNTER — Emergency Department (HOSPITAL_BASED_OUTPATIENT_CLINIC_OR_DEPARTMENT_OTHER): Payer: Medicare Other

## 2022-05-08 ENCOUNTER — Other Ambulatory Visit: Payer: Self-pay

## 2022-05-08 ENCOUNTER — Emergency Department (HOSPITAL_BASED_OUTPATIENT_CLINIC_OR_DEPARTMENT_OTHER)
Admission: EM | Admit: 2022-05-08 | Discharge: 2022-05-08 | Disposition: A | Discharge status: Home / Self Care | Payer: Medicare Other | Attending: Emergency Medicine | Admitting: Emergency Medicine

## 2022-05-08 ENCOUNTER — Encounter (HOSPITAL_BASED_OUTPATIENT_CLINIC_OR_DEPARTMENT_OTHER): Payer: Self-pay

## 2022-05-08 ENCOUNTER — Telehealth: Payer: Self-pay | Admitting: Family Medicine

## 2022-05-08 DIAGNOSIS — R0789 Other chest pain: Secondary | ICD-10-CM | POA: Insufficient documentation

## 2022-05-08 DIAGNOSIS — I10 Essential (primary) hypertension: Secondary | ICD-10-CM | POA: Insufficient documentation

## 2022-05-08 DIAGNOSIS — R079 Chest pain, unspecified: Secondary | ICD-10-CM | POA: Diagnosis not present

## 2022-05-08 LAB — BASIC METABOLIC PANEL WITH GFR
Anion gap: 9 (ref 5–15)
BUN: 19 mg/dL (ref 8–23)
CO2: 28 mmol/L (ref 22–32)
Calcium: 9.2 mg/dL (ref 8.9–10.3)
Chloride: 100 mmol/L (ref 98–111)
Creatinine, Ser: 1.08 mg/dL (ref 0.61–1.24)
GFR, Estimated: >60 mL/min
Glucose, Bld: 96 mg/dL (ref 70–99)
Potassium: 4.4 mmol/L (ref 3.5–5.1)
Sodium: 137 mmol/L (ref 135–145)

## 2022-05-08 LAB — CBC
HCT: 41.3 % (ref 39.0–52.0)
Hemoglobin: 14.4 g/dL (ref 13.0–17.0)
MCH: 30.3 pg (ref 26.0–34.0)
MCHC: 34.9 g/dL (ref 30.0–36.0)
MCV: 86.8 fL (ref 80.0–100.0)
Platelets: 198 K/uL (ref 150–400)
RBC: 4.76 MIL/uL (ref 4.22–5.81)
RDW: 12.2 % (ref 11.5–15.5)
WBC: 5.5 K/uL (ref 4.0–10.5)
nRBC: 0 % (ref 0.0–0.2)

## 2022-05-08 LAB — TROPONIN I (HIGH SENSITIVITY): Troponin I (High Sensitivity): 4 ng/L (ref <18)

## 2022-05-08 LAB — D-DIMER, QUANTITATIVE: D-Dimer, Quant: 0.27 ug{FEU}/mL (ref 0.00–0.50)

## 2022-05-08 NOTE — Telephone Encounter (Signed)
Caller Name: Jermon Humpert Ph #: [phone] Chief Complaint: Chest pains   This call was transferred to Nurse Triage/Access Nurse. This is for documentation purposes. No follow up required at this time.

## 2022-05-08 NOTE — Discharge Instructions (Addendum)
Thank you for coming to [facility] Emergency Department. You were seen for intermittent chest pain. We did an exam, labs, and imaging, and these showed no acute findings.  Please follow up with a cardiologist within 1-2 weeks. We have placed a referral for the [facility] office in [place].  You can alternate taking Tylenol and ibuprofen as needed for pain. You can take 650mg  tylenol (acetaminophen) every 4-6 hours, and 600 mg ibuprofen 3 times a day.   Do not hesitate to return to the ED or call 911 if you experience: -Worsening symptoms -Lightheadedness, passing out -Fevers/chills -Anything else that concerns you

## 2022-05-08 NOTE — Telephone Encounter (Signed)
Noted. Dm/cma  

## 2022-05-08 NOTE — ED Triage Notes (Signed)
C/o intermittent chest pain x 2 weeks. Not associated with activity. Radiates to left shoulder. No other symptoms

## 2022-05-08 NOTE — ED Provider Notes (Signed)
[facility] Provider Note   CSN: [id number] Arrival date & time: 05/08/22  1403     History  Chief Complaint  Patient presents with   Chest Pain    Clifford Gomez is a 67 y.o. male with HTN, HLD, obesity, GERD, gout, who presents with CP.   C/o intermittent chest pain x 2 weeks. Varies in location, most of hte time in L axilla, but sometimes in front central, but also in top of L shoulder. Pain relatively short/quick, not terribly high level, but no h/o similar and not normal for patient. Not going away. Hasn't tried anything for it. Not associated with activity. Patient states he's had 2 stress tests in the past, and they were bot normal, but it was at least 6 years ago.  Denies SOB, cough, f/c, abd pain, N/V/D/C, urinary sxs, leg swelling, recent travels, recent hospitalizations, no h/o DVT/PE.    Chest Pain      Home Medications Prior to Admission medications   Medication Sig Start Date End Date Taking? Authorizing Provider  allopurinol (ZYLOPRIM) 300 MG tablet Take 300 mg by mouth daily.    [provider]  cetirizine (ZYRTEC) 10 MG tablet Take 1 tablet by mouth daily as needed.    [provider]  diphenhydrAMINE (BENADRYL) 25 mg capsule Take 1 capsule by mouth daily as needed.    [provider]  IBUPROFEN IB PO Take 1 tablet by mouth daily as needed.    [provider]  pantoprazole (PROTONIX) 40 MG tablet Take 1 tablet (40 mg total) by mouth daily as needed. 04/05/22   Haydee Salter, MD  tadalafil (CIALIS) 20 MG tablet Take by mouth.    [provider]  valACYclovir (VALTREX) 1000 MG tablet     [provider]      Allergies    Ezetimibe, Pravastatin, and Simvastatin    Review of Systems   Review of Systems  Cardiovascular:  Positive for chest pain.   Review of systems Negative for f/c.  A 10 point review of systems was performed and is negative unless otherwise  reported in HPI.  Physical Exam Updated Vital Signs BP (!) 163/82   Pulse 74   Temp 97.9 F (36.6 C) (Oral)   Resp 16   Ht 5' 9.5" (1.765 m)   Wt 90.7 kg   SpO2 98%   BMI 29.11 kg/m  Physical Exam General: Normal appearing male, lying in bed.  HEENT: PERRLA, Sclera anicteric, MMM, trachea midline.  Cardiology: RRR, no murmurs/rubs/gallops. BL radial and DP pulses equal bilaterally. Nontender chest wall, no signs of trauma or crepitus. Resp: Normal respiratory rate and effort. CTAB, no wheezes, rhonchi, crackles.  Abd: Soft, non-tender, non-distended. No rebound tenderness or guarding.  GU: Deferred. MSK: No peripheral edema or signs of trauma. Extremities without deformity or TTP. No cyanosis or clubbing. Skin: warm, dry. Neuro: A&Ox4, CNs II-XII grossly intact. MAEs. Sensation grossly intact.  Psych: Normal mood and affect.   ED Results / Procedures / Treatments   Labs (all labs ordered are listed, but only abnormal results are displayed) Labs Reviewed  BASIC METABOLIC PANEL  CBC  D-DIMER, QUANTITATIVE  TROPONIN I (HIGH SENSITIVITY)  TROPONIN I (HIGH SENSITIVITY)    EKG None  Radiology DG Chest 2 View  Result Date: 05/08/2022 CLINICAL DATA:  chest pain EXAM: CHEST - 2 VIEW COMPARISON:  09/17/2017 FINDINGS: Cardiac silhouette is unremarkable. No pneumothorax or pleural effusion. The lungs are  clear. The visualized skeletal structures are unremarkable. IMPRESSION: No acute cardiopulmonary process. Electronically Signed   By: Sammie Bench M.D.   On: 05/08/2022 14:34    Procedures Procedures    Medications Ordered in ED Medications - No data to display  ED Course/ Medical Decision Making/ A&P                          Medical Decision Making Amount and/or Complexity of Data Reviewed Labs: ordered. Decision-making details documented in ED Course. Radiology: ordered. Decision-making details documented in ED Course.    This patient presents to the ED for  concern of chest pain, this involves an extensive number of treatment options, and is a complaint that carries with it a high risk of complications and morbidity.  I considered the following differential and admission for this acute, potentially life threatening condition.   MDM:    DDX for chest pain includes but is not limited to:  ACS/arrhythmia,  PE, aortic dissection, PNA, PTX, esophogeal rupture, pericarditis, GERD/PUD/gastritis, or musculoskeletal pain. Very low suspicion for ACS vs aortic dissection given presenting sx. EKG without ischemic changes, troponin is negative at 4. Patient cannot PERC out based on age, but d-dimer is negative and minimal risk factors for PE. No radiation of pain to the back, no tearing pain, no associated symptoms, very low c/f dissection. No abdominal pain and no c/f biliary disease. CXR clear of PTX/PNA.    Clinical Course as of 05/08/22 1555  Mon May 08, 2022  1509 Troponin I (High Sensitivity): 4 [HN]  0000000 Basic metabolic panel wnl [HN]  0000000 CBC wnl [HN]  1509 DG Chest 2 View No acute cardiopulmonary process. [HN]  1549 D-Dimer, Quant: 0.27 [HN]    Clinical Course User Index [HN] Audley Hose, MD    Labs: I Ordered, and personally interpreted labs.  The pertinent results include:  those listed above  Imaging Studies ordered: I ordered imaging studies including CXR I independently visualized and interpreted imaging. I agree with the radiologist interpretation  Additional history obtained from friend at bedside, chart review.    Cardiac Monitoring: The patient was maintained on a cardiac monitor.  I personally viewed and interpreted the cardiac monitored which showed an underlying rhythm of: NSR  Social Determinants of Health: Patient lives independently   Disposition:  Patient's HEART score is 3. Will be given o/p referral to cardiology. DC w/ discharge instructions/return precautions. All questions answered to patient's  satisfaction.    Co morbidities that complicate the patient evaluation  Past Medical History:  Diagnosis Date   Arthritis    joints   Fainting spell    10 years ago   GERD (gastroesophageal reflux disease)    Hyperlipidemia    on meds   Hypertension    on meds   Seasonal allergies      Medicines No orders of the defined types were placed in this encounter.   I have reviewed the patients home medicines and have made adjustments as needed  Problem List / ED Course: Problem List Items Addressed This Visit       Other   Atypical chest pain - Primary   Relevant Orders   Ambulatory referral to Cardiology                This note was created using dictation software, which may contain spelling or grammatical errors.    Audley Hose, MD 05/08/22 [phone]

## 2022-05-09 ENCOUNTER — Encounter: Payer: Self-pay | Admitting: Family Medicine

## 2022-05-09 ENCOUNTER — Ambulatory Visit (INDEPENDENT_AMBULATORY_CARE_PROVIDER_SITE_OTHER): Payer: Medicare Other | Admitting: Family Medicine

## 2022-05-09 VITALS — BP 122/66 | HR 78 | Temp 98.6°F | Ht 69.5 in | Wt 201.6 lb

## 2022-05-09 DIAGNOSIS — I10 Essential (primary) hypertension: Secondary | ICD-10-CM

## 2022-05-09 DIAGNOSIS — K529 Noninfective gastroenteritis and colitis, unspecified: Secondary | ICD-10-CM | POA: Diagnosis not present

## 2022-05-09 DIAGNOSIS — H9193 Unspecified hearing loss, bilateral: Secondary | ICD-10-CM

## 2022-05-09 DIAGNOSIS — E782 Mixed hyperlipidemia: Secondary | ICD-10-CM

## 2022-05-09 DIAGNOSIS — M109 Gout, unspecified: Secondary | ICD-10-CM | POA: Diagnosis not present

## 2022-05-09 DIAGNOSIS — R0789 Other chest pain: Secondary | ICD-10-CM | POA: Diagnosis not present

## 2022-05-09 NOTE — Progress Notes (Signed)
[facility] [address] [place] [address] Dept: [phone] Dept Fax: [facility] Follow-up Visit  Subjective:    Patient ID: Clifford Gomez, male    DOB: 1955/08/07, 67 y.o..   MRN: [id number]  Chief Complaint  Patient presents with   Hospitalization [facility] f/u for chest pains 05/08/22.    History of Present Illness:  Patient is in today for follow-up form a recent ED visit. Mr. Palmateer was seen yesterday at [facility] ED. He had a 2-week history of intermittent pain in the left chest. This was more towards the left axilla and did seem to be more related to movement of the left arm. The pain would last for brief periods. He had a cardiac workup which did not show evidence of an acute CV event. He notes he is feeling okay today.   Mr. Rubey has a history of hypertension in the past. He had previously been on medication, but had been able to stop this with time. At his last visit, his BP was mildly high.  Mr. Jafri has a history of hyperlipidemia. He has not tolerated statins. I referred him to the [facility]. He notes he is not able to be seen until Sept.  Mr. Matsuda has a history of intermittent gout involving his right ankle. He manages this with allopurinol as needed for attacks.  At his last visit, Mr. Schwabe complained of a 30-month history of chronic diarrhea. He ntoes this is not improved.   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   Allergy to alpha-gal 04/05/2022   Statin intolerance 04/05/2022   Intracranial arachnoid cyst 04/04/2022   Body mass  index (BMI) 28.0-28.9, adult 07/22/2019   Essential (primary) hypertension 07/22/2019   Cervical radiculopathy 10/07/2013   Personal history of colonic polyps 04/16/2013   Melanocytic nevi of trunk 05/08/2012   Memory impairment 05/08/2012   Atypical chest pain 05/08/2012   Hyperlipidemia    Past Surgical History:  Procedure Laterality Date   ANKLE SURGERY Right    COLONOSCOPY  2015   JP-MAC-movi(exc)-TA   POLYPECTOMY     TONSILLECTOMY     WISDOM TOOTH EXTRACTION     Family History  Problem Relation Age of Onset   Hyperlipidemia Mother    Dementia Mother    Hypertension Father    Hyperlipidemia Father    Breast cancer Sister    Hypertension Sister    Hypertension Brother    Stroke Paternal Aunt    Cancer Maternal Grandmother    Diabetes Maternal Grandfather    Heart disease Maternal Grandfather    Alcohol abuse Paternal Grandfather    Colon cancer Neg Hx    Stomach cancer Neg Hx    Colon polyps Neg Hx    Esophageal cancer Neg Hx    Rectal cancer Neg Hx    Outpatient Medications Prior to Visit  Medication Sig Dispense Refill   allopurinol (ZYLOPRIM) 300 MG tablet Take 300 mg by mouth daily.     cetirizine (ZYRTEC) 10 MG tablet Take 1 tablet by mouth daily as needed.     diphenhydrAMINE (BENADRYL) 25 mg capsule Take 1  capsule by mouth daily as needed.     IBUPROFEN IB PO Take 1 tablet by mouth daily as needed.     pantoprazole (PROTONIX) 40 MG tablet Take 1 tablet (40 mg total) by mouth daily as needed. 30 tablet 5   tadalafil (CIALIS) 20 MG tablet Take by mouth.     valACYclovir (VALTREX) 1000 MG tablet      No facility-administered medications prior to visit.   Allergies  Allergen Reactions   Ezetimibe Other (See Comments)   Pravastatin Other (See Comments)    Muscle cramps   Simvastatin Other (See Comments)    Muscle cramps   Objective:   Today's Vitals   05/09/22 1407  BP: 122/66  Pulse: 78  Temp: 98.6 F (37 C)  TempSrc: Temporal  SpO2: 98%   Weight: 201 lb 9.6 oz (91.4 kg)  Height: 5' 9.5" (1.765 m)   Body mass index is 29.34 kg/m.   General: Well developed, well nourished. No acute distress. Psych: Alert and oriented. Normal mood and affect.  Health Maintenance Due  Topic Date Due   Medicare Annual Wellness (AWV)  Never done   Hepatitis C Screening  Never done   Zoster Vaccines- Shingrix (1 of 2) Never done   Pneumonia Vaccine 54+ Years old (1 of 1 - PCV) Never done   Lab Results    Latest Ref Rng & Units 05/08/2022    2:17 PM 04/05/2022    9:12 AM 04/30/2012   12:00 AM  CBC  WBC 4.0 - 10.5 K/uL 5.5  4.8  6.1      Hemoglobin 13.0 - 17.0 g/dL 14.4  14.9  15.7      Hematocrit 39.0 - 52.0 % 41.3  42.3  45      Platelets 150 - 400 K/uL 198  187.0  200         This result is from an external source.      Latest Ref Rng & Units 05/08/2022    2:17 PM 04/05/2022    9:12 AM 04/30/2012   12:00 AM  CMP  Glucose 70 - 99 mg/dL 96  103    BUN 8 - 23 mg/dL 19  25    Creatinine 0.61 - 1.24 mg/dL 1.08  1.08  1.1      Sodium 135 - 145 mmol/L 137  135    Potassium 3.5 - 5.1 mmol/L 4.4  4.9  5.1      Chloride 98 - 111 mmol/L 100  101    CO2 22 - 32 mmol/L 28  27    Calcium 8.9 - 10.3 mg/dL 9.2  10.0    Total Protein 6.0 - 8.3 g/dL  7.3    Total Bilirubin 0.2 - 1.2 mg/dL  0.7    Alkaline Phos 39 - 117 U/L  48    AST 0 - 37 U/L  24  21      ALT 0 - 53 U/L  21  20         This result is from an external source.   Last lipids Lab Results  Component Value Date   CHOL 246 (H) 04/05/2022   HDL 57.30 04/05/2022   LDLCALC 174 (H) 04/05/2022   TRIG 72.0 04/05/2022   CHOLHDL 4 04/05/2022   Last thyroid functions Lab Results  Component Value Date   TSH 1.16 04/05/2022   T4TOTAL 8.4 05/08/2012   Component Ref Range & Units 1 mo ago  Antigliadin Abs, IgA 0 -  19 units 4  Comment:                    Negative                   0 - 19                    Weak Positive             20 - 30                    Moderate to Strong  Positive   >30  Gliadin IgG 0 - 19 units 2  Comment:                    Negative                   0 - 19                    Weak Positive             20 - 30                    Moderate to Strong Positive   >30  Transglutaminase IgA 0 - 3 U/mL <2  Comment:                               Negative        0 -  3                               Weak Positive   4 - 10                               Positive           >10  Tissue Transglutaminase (tTG) has been identified  as the endomysial antigen.  Studies have demonstr-  ated that endomysial IgA antibodies have over 99%  specificity for gluten sensitive enteropathy.  Resulting Agency [facility]            Lab Results  Component Value Date   LABURIC 6.8 04/05/2022   Component Ref Range & Units 3 wk ago  Calprotectin, Fecal 0 - 120 ug/g 13   Giardia toxin Component 3 wk ago  MICRO NUMBER: [id number]  SPECIMEN QUALITY: Adequate  Source: STOOL  STATUS: FINAL  RESULT: Not Detected     Assessment & Plan:   Problem List Items Addressed This Visit       Cardiovascular and Mediastinum   Essential (primary) hypertension    BP is normal today. We will continue to monitor this off of meds.        Digestive   Chronic diarrhea    Etiology remains elusive. I will refer Mr. Truelove to GI for an assessment.      Relevant Orders   Ambulatory referral to Gastroenterology     Other   Hyperlipidemia    Lipids remain high. Patient's ACC/AHA 10-yr CV risk is 14.7% (intermediate risk). I have referred him to Dr. Debara Pickett for other treatment recommendations, as he has not tolerated statins.        Atypical chest pain - Primary    Appears to be more musculoskeletal  at this point. We will focus on other aspects of CV risk reduction, including trying to address his lipids, as noted above.      Gout    Uric acid level is well controlled, so he can continue to use allopurinol as needed.       Return in about 3 months (around 08/08/2022)  for Annual preventative care.   Haydee Salter, MD

## 2022-05-09 NOTE — Assessment & Plan Note (Signed)
Lipids remain high. Patient's ACC/AHA 10-yr CV risk is 14.7% (intermediate risk). I have referred him to Dr. Debara Pickett for other treatment recommendations, as he has not tolerated statins.

## 2022-05-09 NOTE — Assessment & Plan Note (Signed)
Uric acid level is well controlled, so he can continue to use allopurinol as needed.

## 2022-05-09 NOTE — Assessment & Plan Note (Signed)
Etiology remains elusive. I will refer Clifford Gomez to GI for an assessment.

## 2022-05-09 NOTE — Assessment & Plan Note (Signed)
Appears to be more musculoskeletal at this point. We will focus on other aspects of CV risk reduction, including trying to address his lipids, as noted above.

## 2022-05-09 NOTE — Assessment & Plan Note (Signed)
BP is normal today. We will continue to monitor this off of meds.

## 2022-05-10 NOTE — Addendum Note (Signed)
Addended by: Haydee Salter on: 05/10/2022 01:03 PM   Modules accepted: Orders

## 2022-06-01 ENCOUNTER — Encounter: Payer: Self-pay | Admitting: Family Medicine

## 2022-06-01 ENCOUNTER — Ambulatory Visit: Payer: Medicare Other | Attending: Audiology | Admitting: Audiology

## 2022-06-01 DIAGNOSIS — H903 Sensorineural hearing loss, bilateral: Secondary | ICD-10-CM | POA: Insufficient documentation

## 2022-06-01 DIAGNOSIS — H905 Unspecified sensorineural hearing loss: Secondary | ICD-10-CM | POA: Insufficient documentation

## 2022-06-01 NOTE — Procedures (Signed)
  Outpatient Audiology and [facility] [address] [place], [place]  [address] [phone]  AUDIOLOGICAL  EVALUATION  NAME: Clifford Gomez     DOB:   11/22/1955      MRN: [id number]                                                                                     DATE: 06/01/2022     REFERENT: Loyola Mast, MD STATUS: Outpatient DIAGNOSIS: Sensorineural hearing loss   History: Bazil was seen for an audiological evaluation due to decreased hearing and difficulty hearing in background noise occurring for 2+ years. Draedyn denies otalgia, tinnitus, aural fullness, and dizziness. Zaryan reports increased difficulty hearing in noisy environments and hearing speech around background noise.  Evaluation:  Otoscopy showed a clear view of the tympanic membranes, bilaterally Tympanometry results were consistent with normal middle ear pressure and normal tympanic membrane mobility (Type A), bilaterally.  Audiometric testing was completed using Conventional Audiometry techniques with insert earphones and TDH headphones. Test results are consistent with essentially normal hearing sensitivity in both ears with the exception of a moderate hearing loss at 8000 Hz int he left ear. Speech Recognition Thresholds were obtained at 20 dB HL in the right ear and at 15  dB HL in the left ear. Word Recognition Testing was completed at 70 dB HL and Kennen scored 100%, bilaterally.      Results:  The test results were reviewed with Mellody Dance. Today's results are consistent with essentially normal hearing sensitivity in both ears with the exception of a moderate hearing loss at 8000 Hz int he left ear. Emil will have hearing and communication difficulty in adverse listening environments such as a noisy setting Marquon will benefit from the use of good communication strategies. Hearing aids are not recommended.   Recommendations: 1.   Continue to monitor hearing sensitivity. Return in 2-3 years for  audiological monitoring.  2.   ENT referral for asymmetry at 8000 Hz if motivated.     30 minutes spent testing and counseling on results.   If you have any questions please feel free to contact me at [phone].  Marton Redwood Audiologist, Au.D., CCC-A 06/01/2022  10:01 AM  Cc: Loyola Mast, MD

## 2022-06-02 ENCOUNTER — Encounter: Payer: Self-pay | Admitting: *Deleted

## 2022-06-09 ENCOUNTER — Telehealth: Payer: Self-pay | Admitting: Family Medicine

## 2022-06-09 NOTE — Telephone Encounter (Signed)
Contacted Clifford Gomez to schedule their annual wellness visit. Appointment made for 06/30/22.  Clifford Gomez AWV direct phone # [phone]

## 2022-06-30 ENCOUNTER — Ambulatory Visit (INDEPENDENT_AMBULATORY_CARE_PROVIDER_SITE_OTHER): Payer: Medicare Other | Admitting: Family Medicine

## 2022-06-30 ENCOUNTER — Encounter: Payer: Self-pay | Admitting: Family Medicine

## 2022-06-30 ENCOUNTER — Ambulatory Visit: Payer: Medicare Other

## 2022-06-30 VITALS — BP 128/74 | HR 85 | Temp 98.8°F | Ht 69.5 in | Wt 198.4 lb

## 2022-06-30 DIAGNOSIS — J3089 Other allergic rhinitis: Secondary | ICD-10-CM

## 2022-06-30 DIAGNOSIS — J0140 Acute pansinusitis, unspecified: Secondary | ICD-10-CM | POA: Diagnosis not present

## 2022-06-30 LAB — POC COVID19 BINAXNOW: SARS Coronavirus 2 Ag: NEGATIVE

## 2022-06-30 MED ORDER — AMOXICILLIN-POT CLAVULANATE 875-125 MG PO TABS
1.0000 | ORAL_TABLET | Freq: Two times a day (BID) | ORAL | 0 refills | Status: DC
Start: 2022-06-30 — End: 2022-07-26

## 2022-06-30 NOTE — Assessment & Plan Note (Signed)
Recommend he continue his Zyrtec 10 mg daily and use of saline nasal washes. I do recommend he add a daily nasal steroid spray to his routine.

## 2022-06-30 NOTE — Patient Instructions (Signed)
Start using a daily nasal steroid spray.

## 2022-06-30 NOTE — Progress Notes (Signed)
[facility] [address] [place] [address] Dept: [phone] Dept Fax: [phone]  Office Visit  Subjective:    Patient ID: Clifford Gomez, male    DOB: 06-29-1955, 67 y.o..   MRN: [id number]  Chief Complaint  Patient presents with   Sinusitis    C/o having sinus HA's x 4 days.  Has taken Zyrtec and Advil.     History of Present Illness:  Patient is in today with worsening headache and pain/pressure over his sinuses. Clifford Gomez has a history of nasal allergies. He uses Zyrtec and a nettie pot on a regular basis. He has had clear rhinorrhea. However, he does admit to some sensitivity of his maxillary teeth and increased discomfort int he sinuses with bending over.  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   Allergy to alpha-gal 04/05/2022   Statin intolerance 04/05/2022   Intracranial arachnoid cyst 04/04/2022   Body mass index (BMI) 28.0-28.9, adult 07/22/2019   Essential (primary) hypertension 07/22/2019   Cervical radiculopathy 10/07/2013   Personal history of colonic polyps 04/16/2013   Melanocytic nevi of trunk 05/08/2012   Memory impairment 05/08/2012   Atypical chest pain 05/08/2012   Hyperlipidemia    Past Surgical History:  Procedure Laterality Date   ANKLE SURGERY Right    COLONOSCOPY  2015   JP-MAC-movi(exc)-TA   POLYPECTOMY     TONSILLECTOMY     WISDOM TOOTH EXTRACTION     Family History  Problem Relation Age of Onset   Hyperlipidemia Mother    Dementia Mother    Hypertension Father    Hyperlipidemia Father    Breast  cancer Sister    Hypertension Sister    Hypertension Brother    Stroke Paternal Aunt    Cancer Maternal Grandmother    Diabetes Maternal Grandfather    Heart disease Maternal Grandfather    Alcohol abuse Paternal Grandfather    Colon cancer Neg Hx    Stomach cancer Neg Hx    Colon polyps Neg Hx    Esophageal cancer Neg Hx    Rectal cancer Neg Hx    Outpatient Medications Prior to Visit  Medication Sig Dispense Refill   allopurinol (ZYLOPRIM) 300 MG tablet Take 300 mg by mouth daily.     cetirizine (ZYRTEC) 10 MG tablet Take 1 tablet by mouth daily as needed.     diphenhydrAMINE (BENADRYL) 25 mg capsule Take 1 capsule by mouth daily as needed.     IBUPROFEN IB PO Take 1 tablet by mouth daily as needed.     pantoprazole (PROTONIX) 40 MG tablet Take 1 tablet (40 mg total) by mouth daily as needed. 30 tablet 5   tadalafil (CIALIS) 20 MG tablet Take by mouth.     valACYclovir (VALTREX) 1000 MG tablet      No facility-administered medications prior to visit.   Allergies  Allergen Reactions   Ezetimibe Other (See Comments)   Pravastatin Other (See Comments)    Muscle cramps   Simvastatin Other (See Comments)    Muscle cramps     Objective:   Today's Vitals   06/30/22 6962  BP: 128/74  Pulse: 85  Temp: 98.8 F (37.1 C)  TempSrc: Temporal  SpO2: 97%  Weight: 198 lb 6.4 oz (90 kg)  Height: 5' 9.5" (1.765 m)   Body mass index is 28.88 kg/m.   General: Well developed, well nourished. No acute distress. HEENT: Normocephalic, non-traumatic. Eyes clear. External ears normal. EAC and TMs normal bilaterally. Nose with mild   congestion and pale, swollen turbinates. No significant rhinorrhea. There is pain on percussion over the sinuses. Mucous   membranes moist. Oropharynx clear. Good dentition. Neck: Supple. No lymphadenopathy. No thyromegaly. Lungs: Clear to auscultation bilaterally. No wheezing, rales or rhonchi. Psych: Alert and oriented. Normal mood and affect.  Health  Maintenance Due  Topic Date Due   Medicare Annual Wellness (AWV)  Never done   Hepatitis C Screening  Never done   Zoster Vaccines- Shingrix (1 of 2) Never done   Pneumonia Vaccine 44+ Years old (1 of 1 - PCV) Never done     Lab Results: POCT Covid: Neg.  Assessment & Plan:   Problem List Items Addressed This Visit       Respiratory   Perennial allergic rhinitis    Recommend he continue his Zyrtec 10 mg daily and use of saline nasal washes. I do recommend he add a daily nasal steroid spray to his routine.      Other Visit Diagnoses     Acute non-recurrent pansinusitis    -  Primary   Clincially, has sinusitis. I will treat him with a course of Augmentin.   Relevant Medications   amoxicillin-clavulanate (AUGMENTIN) 875-125 MG tablet   Other Relevant Orders   POC COVID-19 (Completed)       Return if symptoms worsen or fail to improve.   Loyola Mast, MD

## 2022-07-26 ENCOUNTER — Ambulatory Visit (INDEPENDENT_AMBULATORY_CARE_PROVIDER_SITE_OTHER): Payer: Medicare Other | Admitting: Gastroenterology

## 2022-07-26 ENCOUNTER — Encounter: Payer: Self-pay | Admitting: Gastroenterology

## 2022-07-26 VITALS — BP 130/84 | HR 79 | Ht 70.0 in | Wt 198.0 lb

## 2022-07-26 DIAGNOSIS — R197 Diarrhea, unspecified: Secondary | ICD-10-CM | POA: Diagnosis not present

## 2022-07-26 NOTE — Progress Notes (Signed)
Noted  

## 2022-07-26 NOTE — Progress Notes (Signed)
07/26/2022 Clifford Gomez [id number] 1955/11/21   HISTORY OF PRESENT ILLNESS: This is a 67 year old male who is a patient of Dr. Lamar Sprinkles.  He had a colonoscopy with him in June 2022 at which time he had 5 polyps removed.  Tubular adenomas.  Repeat recommended at 3-year interval.  He is here today with complaints of diarrhea.  He says that he tends to get diarrhea after eating certain foods.  Says that sometimes even chicken can give him diarrhea.  Can have a bowel movement sometimes every time that he eats so sometimes up to 4-5 times a day.  Overall this been going on for the past couple of years.  He says that he took a course of Augmentin last month for sinusitis and since taking that his stools have been more solid and soft, never firm, but improved compared to what they were previously.  He denies seeing blood in his stool.  No abdominal pain.  Past Medical History:  Diagnosis Date   Arthritis    joints   Fainting spell    10 years ago   GERD (gastroesophageal reflux disease)    Hyperlipidemia    on meds   Hypertension    on meds   Seasonal allergies    Past Surgical History:  Procedure Laterality Date   ANKLE SURGERY Right    COLONOSCOPY  2015   JP-MAC-movi(exc)-TA   POLYPECTOMY     TONSILLECTOMY     WISDOM TOOTH EXTRACTION      reports that he has never smoked. He has never used smokeless tobacco. He reports that he does not currently use alcohol after a past usage of about 16.0 standard drinks of alcohol per week. He reports that he does not use drugs. family history includes Alcohol abuse in his paternal grandfather; Breast cancer in his sister; Cancer in his maternal grandmother; Dementia in his mother; Diabetes in his maternal grandfather; Heart disease in his maternal grandfather; Hyperlipidemia in his father and mother; Hypertension in his brother, father, and sister; Stroke in his paternal aunt. Allergies  Allergen Reactions   Ezetimibe Other (See Comments)    Pravastatin Other (See Comments)    Muscle cramps   Simvastatin Other (See Comments)    Muscle cramps      Outpatient Encounter Medications as of 07/26/2022  Medication Sig   allopurinol (ZYLOPRIM) 300 MG tablet Take 300 mg by mouth daily.   cetirizine (ZYRTEC) 10 MG tablet Take 1 tablet by mouth daily as needed.   diphenhydrAMINE (BENADRYL) 25 mg capsule Take 1 capsule by mouth daily as needed.   IBUPROFEN IB PO Take 1 tablet by mouth daily as needed.   pantoprazole (PROTONIX) 40 MG tablet Take 1 tablet (40 mg total) by mouth daily as needed.   tadalafil (CIALIS) 20 MG tablet Take by mouth.   valACYclovir (VALTREX) 1000 MG tablet    [DISCONTINUED] amoxicillin-clavulanate (AUGMENTIN) 875-125 MG tablet Take 1 tablet by mouth 2 (two) times daily.   No facility-administered encounter medications on file as of 07/26/2022.    REVIEW OF SYSTEMS  : All other systems reviewed and negative except where noted in the History of Present Illness.   PHYSICAL EXAM: BP 130/84   Pulse 79   Ht 5\' 10"  (1.778 m)   Wt 198 lb (89.8 kg)   BMI 28.41 kg/m  General: Well developed white male in no acute distress Head: Normocephalic and atraumatic Eyes:  Sclerae anicteric, conjunctiva pink. Ears: Normal auditory acuity Musculoskeletal: Symmetrical  with no gross deformities  Skin: No lesions on visible extremities Extremities: No edema  Neurological: Alert oriented x 4, grossly non-focal Psychological:  Alert and cooperative. Normal mood and affect  ASSESSMENT AND PLAN: *Diarrhea, chronic: Reports diarrhea with certain foods, sometimes even with certain chicken.  This is probably more so how food is prepared and what is being used to prepare it, such as butter or oils, etc.  Reports improvement after taking course of Augmentin for sinusitis.  It is possible that this is a dietary thing and we discussed the FODMAP diet in detail and he was given literature on this.  He is going to look into doing this.   Also, maybe had a component of SIBO, which improved with the Augmentin.  Could consider SIBO breath test in the future or retreating with a course of Xifaxan if symptoms recur.  He is due for colonoscopy next year so certainly if having diarrhea again around that time could consider random biopsies to rule out microscopic colitis.   CC:  Loyola Mast, MD

## 2022-07-26 NOTE — Patient Instructions (Signed)
See FODmap diet.  Follow up as needed.  _______________________________________________________  If your blood pressure at your visit was 140/90 or greater, please contact your primary care physician to follow up on this.  _______________________________________________________  If you are age 67 or older, your body mass index should be between 23-30. Your Body mass index is 28.41 kg/m. If this is out of the aforementioned range listed, please consider follow up with your Primary Care Provider.  If you are age 52 or younger, your body mass index should be between 19-25. Your Body mass index is 28.41 kg/m. If this is out of the aformentioned range listed, please consider follow up with your Primary Care Provider.   ________________________________________________________  The [facility] providers would like to encourage you to use [facility] to communicate with providers for non-urgent requests or questions.  Due to long hold times on the telephone, sending your provider a message by [facility] may be a faster and more efficient way to get a response.  Please allow 48 business hours for a response.  Please remember that this is for non-urgent requests.  _______________________________________________________

## 2022-08-17 ENCOUNTER — Ambulatory Visit (INDEPENDENT_AMBULATORY_CARE_PROVIDER_SITE_OTHER): Payer: Medicare Other | Admitting: Family Medicine

## 2022-08-17 ENCOUNTER — Encounter: Payer: Self-pay | Admitting: Family Medicine

## 2022-08-17 VITALS — BP 138/80 | HR 76 | Temp 97.0°F | Ht 70.0 in | Wt 201.4 lb

## 2022-08-17 DIAGNOSIS — Z789 Other specified health status: Secondary | ICD-10-CM

## 2022-08-17 DIAGNOSIS — Z125 Encounter for screening for malignant neoplasm of prostate: Secondary | ICD-10-CM | POA: Diagnosis not present

## 2022-08-17 DIAGNOSIS — N529 Male erectile dysfunction, unspecified: Secondary | ICD-10-CM

## 2022-08-17 DIAGNOSIS — E782 Mixed hyperlipidemia: Secondary | ICD-10-CM | POA: Diagnosis not present

## 2022-08-17 DIAGNOSIS — I10 Essential (primary) hypertension: Secondary | ICD-10-CM | POA: Diagnosis not present

## 2022-08-17 DIAGNOSIS — Z1159 Encounter for screening for other viral diseases: Secondary | ICD-10-CM | POA: Diagnosis not present

## 2022-08-17 DIAGNOSIS — R197 Diarrhea, unspecified: Secondary | ICD-10-CM | POA: Diagnosis not present

## 2022-08-17 DIAGNOSIS — Z0000 Encounter for general adult medical examination without abnormal findings: Secondary | ICD-10-CM | POA: Diagnosis not present

## 2022-08-17 DIAGNOSIS — Z23 Encounter for immunization: Secondary | ICD-10-CM

## 2022-08-17 LAB — PSA, MEDICARE: PSA: 0.33 ng/ml (ref 0.10–4.00)

## 2022-08-17 MED ORDER — TADALAFIL 20 MG PO TABS
20.0000 mg | ORAL_TABLET | Freq: Every day | ORAL | 2 refills | Status: AC | PRN
Start: 2022-08-17 — End: ?

## 2022-08-17 NOTE — Assessment & Plan Note (Signed)
Has pending appointment in [facility].

## 2022-08-17 NOTE — Addendum Note (Signed)
Addended by: Loyola Mast on: 08/17/2022 11:36 AM   Modules accepted: Level of Service

## 2022-08-17 NOTE — Assessment & Plan Note (Signed)
I will renew his Cialis.

## 2022-08-17 NOTE — Assessment & Plan Note (Signed)
Improving with low FODMAP diet. This may represent IBS-D.

## 2022-08-17 NOTE — Assessment & Plan Note (Signed)
Blood pressure has been a little high over the past month. I recommend he monitor this at home. If persistent, we would consider adding an antihypertensive medicine back to his regimen.

## 2022-08-17 NOTE — Progress Notes (Signed)
[facility] [address] [place] [address] Dept: [phone] Dept Fax: [phone]  Annual Physical Visit  Subjective:    Patient ID: Clifford Gomez, male    DOB: [birth year], 67 y.o..   MRN: [id number]  Chief Complaint  Patient presents with   Annual Exam    CPE/labs.  Fasting today.  No concerns.   History of Present Illness:  Patient is in today for an annual physical/preventative visit.  Clifford Gomez has a history of hypertension in the past. He is currently managed off of medication.   Clifford Gomez has a history of GERD. He manages this with pantoprazole 40 mg daily PRN.   Clifford Gomez has a history of ED. He manages this with Cialis.   Clifford Gomez has a history of chronic diarrhea.  He was seen by GI. He is now following a low FODMAP diet.He has seen some improvement in his bowel movements.  Review of Systems  Constitutional:  Negative for chills, diaphoresis, fever, malaise/fatigue and weight loss.  HENT:  Negative for congestion, ear pain, hearing loss, sinus pain, sore throat and tinnitus.   Eyes:  Negative for blurred vision, pain, discharge and redness.  Respiratory:  Negative for cough, shortness of breath and wheezing.   Cardiovascular:  Negative for chest pain and palpitations.  Gastrointestinal:  Positive for diarrhea and heartburn. Negative for abdominal pain, constipation, nausea and vomiting.       Occasional flares of GERD with certain trigger foods. managed on Protonix. See above related to potential IBS.  Musculoskeletal:  Negative for back pain, joint pain and myalgias.  Skin:  Negative for itching and rash.  Psychiatric/Behavioral:  Negative for depression. The patient is not nervous/anxious.    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   Allergy to alpha-gal 04/05/2022   Statin intolerance 04/05/2022   Intracranial arachnoid cyst 04/04/2022   Body mass index (BMI) 28.0-28.9, adult 07/22/2019   Essential (primary) hypertension 07/22/2019   Cervical radiculopathy 10/07/2013   Personal history of colonic polyps 04/16/2013   Melanocytic nevi of trunk 05/08/2012   Memory impairment 05/08/2012   Atypical chest pain 05/08/2012   Hyperlipidemia    Past Surgical History:  Procedure Laterality Date   ANKLE SURGERY Right    COLONOSCOPY  2015   JP-MAC-movi(exc)-TA   POLYPECTOMY     TONSILLECTOMY     WISDOM TOOTH EXTRACTION     Family History  Problem Relation Age of Onset   Hyperlipidemia Mother    Dementia Mother    Hypertension Father    Hyperlipidemia Father    Breast cancer Sister    Hypertension Sister    Cancer Brother        prostrate   Hypertension Brother    Stroke Paternal Aunt    Cancer Maternal Grandmother    Diabetes Maternal Grandfather    Heart disease Maternal Grandfather    Alcohol abuse Paternal Grandfather    Colon cancer Neg Hx    Stomach cancer Neg Hx    Colon polyps Neg Hx    Esophageal cancer Neg Hx    Rectal cancer Neg Hx  Outpatient Medications Prior to Visit  Medication Sig Dispense Refill   allopurinol (ZYLOPRIM) 300 MG tablet Take 300 mg by mouth daily.     cetirizine (ZYRTEC) 10 MG tablet Take 1 tablet by mouth daily as needed.     diphenhydrAMINE (BENADRYL) 25 mg capsule Take 1 capsule by mouth daily as needed.     IBUPROFEN IB PO Take 1 tablet by mouth daily as needed.     pantoprazole (PROTONIX) 40 MG tablet Take 1 tablet (40 mg total) by mouth daily as needed. 30 tablet 5   tadalafil (CIALIS) 20 MG tablet Take by mouth.     valACYclovir (VALTREX) 1000 MG tablet       No facility-administered medications prior to visit.   Allergies  Allergen Reactions   Ezetimibe Other (See Comments)   Pravastatin Other (See Comments)    Muscle cramps   Simvastatin Other (See Comments)    Muscle cramps   Objective:   Today's Vitals   08/17/22 0835  BP: 138/80  Pulse: 76  Temp: (!) 97 F (36.1 C)  TempSrc: Temporal  SpO2: 96%  Weight: 201 lb 6.4 oz (91.4 kg)  Height: 5\' 10"  (1.778 m)   Body mass index is 28.9 kg/m.   General: Well developed, well nourished. No acute distress. HEENT: Normocephalic, non-traumatic. PERRL, EOMI. Conjunctiva clear. External ears normal. EAC and TMs normal   bilaterally. Nose clear without congestion or rhinorrhea. Mucous membranes moist. Oropharynx clear. Good dentition. Neck: Supple. No lymphadenopathy. No thyromegaly. Lungs: Clear to auscultation bilaterally. No wheezing, rales or rhonchi. CV: RRR without murmurs or rubs. Pulses 2+ bilaterally. Abdomen: Soft, non-tender. Bowel sounds positive, normal pitch and frequency. No hepatosplenomegaly. No rebound   or guarding. Extremities: Full ROM. No joint swelling or tenderness. No edema noted. Skin: Warm and dry. No rashes. Psych: Alert and oriented. Normal mood and affect.  Health Maintenance Due  Topic Date Due   Medicare Annual Wellness (AWV)  Never done   Hepatitis C Screening  Never done   Zoster Vaccines- Shingrix (1 of 2) Never done     Assessment & Plan:  Annual physical exam  Essential (primary) hypertension Assessment & Plan: Blood pressure has been a little high over the past month. I recommend he monitor this at home. If persistent, we would consider adding an antihypertensive medicine back to his regimen.   Diarrhea, unspecified type Assessment & Plan: Improving with low FODMAP diet. This may represent IBS-D.   Mixed hyperlipidemia Assessment & Plan: Has pending appointment in [facility].   Statin intolerance  Erectile dysfunction,  unspecified erectile dysfunction type Assessment & Plan: I will renew his Cialis.  Orders: -     Tadalafil; Take 1 tablet (20 mg total) by mouth daily as needed for erectile dysfunction.  Dispense: 10 tablet; Refill: 2  Encounter for hepatitis C screening test for low risk patient -     HCV Ab w Reflex to Quant PCR  Screening for prostate cancer -     PSA, Medicare  Need for pneumococcal 20-valent conjugate vaccination -     Pneumococcal conjugate vaccine 20-valent      Return in about 3 months (around 11/17/2022) for Reassessment.   Loyola Mast, MD

## 2022-08-18 LAB — HCV INTERPRETATION

## 2022-08-18 LAB — HCV AB W REFLEX TO QUANT PCR: HCV Ab: NONREACTIVE

## 2022-08-22 ENCOUNTER — Institutional Professional Consult (permissible substitution) (HOSPITAL_BASED_OUTPATIENT_CLINIC_OR_DEPARTMENT_OTHER): Payer: Medicare Other | Admitting: Internal Medicine

## 2022-08-25 ENCOUNTER — Ambulatory Visit (INDEPENDENT_AMBULATORY_CARE_PROVIDER_SITE_OTHER): Payer: Medicare Other

## 2022-08-25 ENCOUNTER — Telehealth: Payer: Self-pay

## 2022-08-25 VITALS — Ht 70.0 in | Wt 200.0 lb

## 2022-08-25 DIAGNOSIS — Z0000 Encounter for general adult medical examination without abnormal findings: Secondary | ICD-10-CM

## 2022-08-25 NOTE — Patient Instructions (Signed)
[name] , Thank you for taking time to come for your Medicare Wellness Visit. I appreciate your ongoing commitment to your health goals. Please review the following plan we discussed and let me know if I can assist you in the future.   These are the goals we discussed:  Goals      Patient Stated     08/25/2022, wants to lose 10 pounds        This is a list of the screening recommended for you and due dates:  Health Maintenance  Topic Date Due   Zoster (Shingles) Vaccine (1 of 2) Never done   COVID-19 Vaccine (1) 09/02/2022*   Flu Shot  09/07/2022   DTaP/Tdap/Td vaccine (2 - Tdap) 02/07/2023   Colon Cancer Screening  08/06/2023   Medicare Annual Wellness Visit  08/25/2023   Pneumonia Vaccine  Completed   Hepatitis C Screening  Completed   HPV Vaccine  Aged Out  *Topic was postponed. The date shown is not the original due date.    Advanced directives: Please bring a copy of your POA (Power of Attorney) and/or Living Will to your next appointment.   Conditions/risks identified: none  Next appointment: Follow up in one year for your annual wellness visit.   Preventive Care 80 Years and Older, Male  Preventive care refers to lifestyle choices and visits with your health care provider that can promote health and wellness. What does preventive care include? A yearly physical exam. This is also called an annual well check. Dental exams once or twice a year. Routine eye exams. Ask your health care provider how often you should have your eyes checked. Personal lifestyle choices, including: Daily care of your teeth and gums. Regular physical activity. Eating a healthy diet. Avoiding tobacco and drug use. Limiting alcohol use. Practicing safe sex. Taking low doses of aspirin every day. Taking vitamin and mineral supplements as recommended by your health care provider. What happens during an annual well check? The services and screenings done by your health care provider during  your annual well check will depend on your age, overall health, lifestyle risk factors, and family history of disease. Counseling  Your health care provider may ask you questions about your: Alcohol use. Tobacco use. Drug use. Emotional well-being. Home and relationship well-being. Sexual activity. Eating habits. History of falls. Memory and ability to understand (cognition). Work and work Astronomer. Screening  You may have the following tests or measurements: Height, weight, and BMI. Blood pressure. Lipid and cholesterol levels. These may be checked every 5 years, or more frequently if you are over 52 years old. Skin check. Lung cancer screening. You may have this screening every year starting at age 11 if you have a 30-pack-year history of smoking and currently smoke or have quit within the past 15 years. Fecal occult blood test (FOBT) of the stool. You may have this test every year starting at age 92. Flexible sigmoidoscopy or colonoscopy. You may have a sigmoidoscopy every 5 years or a colonoscopy every 10 years starting at age 65. Prostate cancer screening. Recommendations will vary depending on your family history and other risks. Hepatitis C blood test. Hepatitis B blood test. Sexually transmitted disease (STD) testing. Diabetes screening. This is done by checking your blood sugar (glucose) after you have not eaten for a while (fasting). You may have this done every 1-3 years. Abdominal aortic aneurysm (AAA) screening. You may need this if you are a current or former smoker. Osteoporosis. You may be  screened starting at age 66 if you are at high risk. Talk with your health care provider about your test results, treatment options, and if necessary, the need for more tests. Vaccines  Your health care provider may recommend certain vaccines, such as: Influenza vaccine. This is recommended every year. Tetanus, diphtheria, and acellular pertussis (Tdap, Td) vaccine. You may need  a Td booster every 10 years. Zoster vaccine. You may need this after age 61. Pneumococcal 13-valent conjugate (PCV13) vaccine. One dose is recommended after age 58. Pneumococcal polysaccharide (PPSV23) vaccine. One dose is recommended after age 4. Talk to your health care provider about which screenings and vaccines you need and how often you need them. This information is not intended to replace advice given to you by your health care provider. Make sure you discuss any questions you have with your health care provider. Document Released: 02/19/2015 Document Revised: 10/13/2015 Document Reviewed: 11/24/2014 Elsevier Interactive Patient Education  2017 [employer].  Fall Prevention in the Home Falls can cause injuries. They can happen to people of all ages. There are many things you can do to make your home safe and to help prevent falls. What can I do on the outside of my home? Regularly fix the edges of walkways and driveways and fix any cracks. Remove anything that might make you trip as you walk through a door, such as a raised step or threshold. Trim any bushes or trees on the path to your home. Use bright outdoor lighting. Clear any walking paths of anything that might make someone trip, such as rocks or tools. Regularly check to see if handrails are loose or broken. Make sure that both sides of any steps have handrails. Any raised decks and porches should have guardrails on the edges. Have any leaves, snow, or ice cleared regularly. Use sand or salt on walking paths during winter. Clean up any spills in your garage right away. This includes oil or grease spills. What can I do in the bathroom? Use night lights. Install grab bars by the toilet and in the tub and shower. Do not use towel bars as grab bars. Use non-skid mats or decals in the tub or shower. If you need to sit down in the shower, use a plastic, non-slip stool. Keep the floor dry. Clean up any water that spills on the  floor as soon as it happens. Remove soap buildup in the tub or shower regularly. Attach bath mats securely with double-sided non-slip rug tape. Do not have throw rugs and other things on the floor that can make you trip. What can I do in the bedroom? Use night lights. Make sure that you have a light by your bed that is easy to reach. Do not use any sheets or blankets that are too big for your bed. They should not hang down onto the floor. Have a firm chair that has side arms. You can use this for support while you get dressed. Do not have throw rugs and other things on the floor that can make you trip. What can I do in the kitchen? Clean up any spills right away. Avoid walking on wet floors. Keep items that you use a lot in easy-to-reach places. If you need to reach something above you, use a strong step stool that has a grab bar. Keep electrical cords out of the way. Do not use floor polish or wax that makes floors slippery. If you must use wax, use non-skid floor wax. Do not have  throw rugs and other things on the floor that can make you trip. What can I do with my stairs? Do not leave any items on the stairs. Make sure that there are handrails on both sides of the stairs and use them. Fix handrails that are broken or loose. Make sure that handrails are as long as the stairways. Check any carpeting to make sure that it is firmly attached to the stairs. Fix any carpet that is loose or worn. Avoid having throw rugs at the top or bottom of the stairs. If you do have throw rugs, attach them to the floor with carpet tape. Make sure that you have a light switch at the top of the stairs and the bottom of the stairs. If you do not have them, ask someone to add them for you. What else can I do to help prevent falls? Wear shoes that: Do not have high heels. Have rubber bottoms. Are comfortable and fit you well. Are closed at the toe. Do not wear sandals. If you use a stepladder: Make sure that  it is fully opened. Do not climb a closed stepladder. Make sure that both sides of the stepladder are locked into place. Ask someone to hold it for you, if possible. Clearly mark and make sure that you can see: Any grab bars or handrails. First and last steps. Where the edge of each step is. Use tools that help you move around (mobility aids) if they are needed. These include: Canes. Walkers. Scooters. Crutches. Turn on the lights when you go into a dark area. Replace any light bulbs as soon as they burn out. Set up your furniture so you have a clear path. Avoid moving your furniture around. If any of your floors are uneven, fix them. If there are any pets around you, be aware of where they are. Review your medicines with your doctor. Some medicines can make you feel dizzy. This can increase your chance of falling. Ask your doctor what other things that you can do to help prevent falls. This information is not intended to replace advice given to you by your health care provider. Make sure you discuss any questions you have with your health care provider. Document Released: 11/19/2008 Document Revised: 07/01/2015 Document Reviewed: 02/27/2014 Elsevier Interactive Patient Education  2017 [employer].

## 2022-08-25 NOTE — Progress Notes (Signed)
Subjective:   Clifford Gomez is a 67 y.o. male who presents for an Initial Medicare Annual Wellness Visit.  Visit Complete: Virtual  I connected with  Sherian Maroon on 08/25/22 by a audio enabled telemedicine application and verified that I am speaking with the correct person using two identifiers.  Patient Location: Home  Provider Location: Office/Clinic  I discussed the limitations of evaluation and management by telemedicine. The patient expressed understanding and agreed to proceed.  Per patient no change in vitals since last visit, unable to obtain new vitals due to telehealth visit     Review of Systems     Cardiac Risk Factors include: advanced age (>25men, >8 women);dyslipidemia;hypertension;male gender     Objective:    Today's Vitals   08/25/22 1025  Weight: 200 lb (90.7 kg)  Height: 5\' 10"  (1.778 m)   Body mass index is 28.7 kg/m.     08/25/2022   10:29 AM 05/08/2022    2:08 PM  Advanced Directives  Does Patient Have a Medical Advance Directive? No Yes  Type of Advance Directive  Living will;Healthcare Power of Attorney  Copy of Healthcare Power of Attorney in Chart?  No - copy requested    Current Medications (verified) Outpatient Encounter Medications as of 08/25/2022  Medication Sig   allopurinol (ZYLOPRIM) 300 MG tablet Take 300 mg by mouth daily.   cetirizine (ZYRTEC) 10 MG tablet Take 1 tablet by mouth daily as needed.   diphenhydrAMINE (BENADRYL) 25 mg capsule Take 1 capsule by mouth daily as needed.   IBUPROFEN IB PO Take 1 tablet by mouth daily as needed.   pantoprazole (PROTONIX) 40 MG tablet Take 1 tablet (40 mg total) by mouth daily as needed.   tadalafil (CIALIS) 20 MG tablet Take 1 tablet (20 mg total) by mouth daily as needed for erectile dysfunction.   valACYclovir (VALTREX) 1000 MG tablet    No facility-administered encounter medications on file as of 08/25/2022.    Allergies (verified) Ezetimibe, Pravastatin, and Simvastatin    History: Past Medical History:  Diagnosis Date   Arthritis    joints   Fainting spell    10 years ago   GERD (gastroesophageal reflux disease)    Hyperlipidemia    on meds   Hypertension    on meds   Seasonal allergies    Past Surgical History:  Procedure Laterality Date   ANKLE SURGERY Right    COLONOSCOPY  2015   JP-MAC-movi(exc)-TA   POLYPECTOMY     TONSILLECTOMY     WISDOM TOOTH EXTRACTION     Family History  Problem Relation Age of Onset   Hyperlipidemia Mother    Dementia Mother    Hypertension Father    Hyperlipidemia Father    Breast cancer Sister    Hypertension Sister    Cancer Brother        prostrate   Hypertension Brother    Stroke Paternal Aunt    Cancer Maternal Grandmother    Diabetes Maternal Grandfather    Heart disease Maternal Grandfather    Alcohol abuse Paternal Grandfather    Colon cancer Neg Hx    Stomach cancer Neg Hx    Colon polyps Neg Hx    Esophageal cancer Neg Hx    Rectal cancer Neg Hx    Social History   Socioeconomic History   Marital status: Significant Other    Spouse name: Not on file   Number of children: 0   Years of education: Not  on file   Highest education level: Bachelor's degree (e.g., BA, AB, BS)  Occupational History   Occupation: Psychologist, sport and exercise    Comment: Careers adviser, Personal assistant  Tobacco Use   Smoking status: Never   Smokeless tobacco: Never  Vaping Use   Vaping status: Never Used  Substance and Sexual Activity   Alcohol use: Yes    Alcohol/week: 16.0 standard drinks of alcohol    Types: 16 Standard drinks or equivalent per week    Comment: occasional   Drug use: No   Sexual activity: Yes  Other Topics Concern   Not on file  Social History Narrative   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   Social Determinants of Health   Financial Resource Strain: Low Risk  (08/25/2022)   Overall Financial Resource Strain (CARDIA)    Difficulty of Paying Living Expenses: Not hard at all  Food  Insecurity: No Food Insecurity (08/25/2022)   Hunger Vital Sign    Worried About Running Out of Food in the Last Year: Never true    Ran Out of Food in the Last Year: Never true  Transportation Needs: No Transportation Needs (08/25/2022)   PRAPARE - Administrator, Civil Service (Medical): No    Lack of Transportation (Non-Medical): No  Physical Activity: Sufficiently Active (08/25/2022)   Exercise Vital Sign    Days of Exercise per Week: 4 days    Minutes of Exercise per Session: 90 min  Stress: No Stress Concern Present (08/25/2022)   [employer] of Occupational Health - Occupational Stress Questionnaire    Feeling of Stress : Not at all  Social Connections: Unknown (08/25/2022)   Social Connection and Isolation Panel [NHANES]    Frequency of Communication with Friends and Family: More than three times a week    Frequency of Social Gatherings with Friends and Family: More than three times a week    Attends Religious Services: Patient declined    Database administrator or Organizations: No    Attends Engineer, structural: Patient declined    Marital Status: Patient declined    Tobacco Counseling Counseling given: Not Answered   Clinical Intake:  Pre-visit preparation completed: Yes  Pain : No/denies pain     Nutritional Status: BMI 25 -29 Overweight Nutritional Risks: None Diabetes: No  How often do you need to have someone help you when you read instructions, pamphlets, or other written materials from your doctor or pharmacy?: 1 - Never  Interpreter Needed?: No  Information entered by :: NAllen LPN   Activities of Daily Living    08/25/2022   10:25 AM  In your present state of health, do you have any difficulty performing the following activities:  Hearing? 1  Comment hard to hear in large crowds  Vision? 0  Difficulty concentrating or making decisions? 0  Walking or climbing stairs? 0  Dressing or bathing? 0  Doing errands, shopping? 0   Preparing Food and eating ? N  Using the Toilet? N  In the past six months, have you accidently leaked urine? N  Do you have problems with loss of bowel control? N  Managing your Medications? N  Managing your Finances? N  Housekeeping or managing your Housekeeping? N    Patient Care Team: Loyola Mast, MD as PCP - General (Family Medicine)  Indicate any recent Medical Services you may have received from other than [facility] providers in the past year (date may be approximate).  Assessment:   This is a routine wellness examination for Neshawn.  Hearing/Vision screen Hearing Screening - Comments:: Had hearing checked recently Vision Screening - Comments:: Regular eye exams, Dr. Hyacinth Meeker  Dietary issues and exercise activities discussed:     Goals Addressed             This Visit's Progress    Patient Stated       08/25/2022, wants to lose 10 pounds       Depression Screen    08/25/2022   10:30 AM 08/17/2022    8:53 AM 04/05/2022    8:22 AM  PHQ 2/9 Scores  PHQ - 2 Score 0 0 0  PHQ- 9 Score 0      Fall Risk    08/25/2022   10:29 AM 08/17/2022    8:52 AM 04/05/2022    8:22 AM  Fall Risk   Falls in the past year? 0 0 0  Number falls in past yr: 0 0 0  Injury with Fall? 0 0 0  Risk for fall due to : Medication side effect No Fall Risks History of fall(s);No Fall Risks  Follow up Falls prevention discussed;Falls evaluation completed Falls evaluation completed Falls evaluation completed    MEDICARE RISK AT HOME:  Medicare Risk at Home - 08/25/22 1029     Any stairs in or around the home? Yes    If so, are there any without handrails? No    Home free of loose throw rugs in walkways, pet beds, electrical cords, etc? Yes    Adequate lighting in your home to reduce risk of falls? Yes    Life alert? No    Use of a cane, walker or w/c? No    Grab bars in the bathroom? No    Shower chair or bench in shower? No    Elevated toilet seat or a handicapped toilet? No              TIMED UP AND GO:  Was the test performed? No    Cognitive Function:        08/25/2022   10:30 AM  6CIT Screen  What Year? 0 points  What month? 0 points  What time? 0 points  Count back from 20 0 points  Months in reverse 0 points  Repeat phrase 0 points  Total Score 0 points    Immunizations Immunization History  Administered Date(s) Administered   PNEUMOCOCCAL CONJUGATE-20 08/17/2022   Td 02/06/2013    TDAP status: Up to date  Flu Vaccine status: Declined, Education has been provided regarding the importance of this vaccine but patient still declined. Advised may receive this vaccine at local pharmacy or Health Dept. Aware to provide a copy of the vaccination record if obtained from local pharmacy or Health Dept. Verbalized acceptance and understanding.  Pneumococcal vaccine status: Up to date  Covid-19 vaccine status: Declined, Education has been provided regarding the importance of this vaccine but patient still declined. Advised may receive this vaccine at local pharmacy or Health Dept.or vaccine clinic. Aware to provide a copy of the vaccination record if obtained from local pharmacy or Health Dept. Verbalized acceptance and understanding.  Qualifies for Shingles Vaccine? Yes   Zostavax completed No   Shingrix Completed?: No.    Education has been provided regarding the importance of this vaccine. Patient has been advised to call insurance company to determine out of pocket expense if they have not yet received this vaccine. Advised may also receive vaccine  at local pharmacy or Health Dept. Verbalized acceptance and understanding.  Screening Tests Health Maintenance  Topic Date Due   Zoster Vaccines- Shingrix (1 of 2) Never done   COVID-19 Vaccine (1) 09/02/2022 (Originally 06/16/1960)   INFLUENZA VACCINE  09/07/2022   DTaP/Tdap/Td (2 - Tdap) 02/07/2023   Colonoscopy  08/06/2023   Medicare Annual Wellness (AWV)  08/25/2023   Pneumonia Vaccine 58+  Years old  Completed   Hepatitis C Screening  Completed   HPV VACCINES  Aged Out    Health Maintenance  Health Maintenance Due  Topic Date Due   Zoster Vaccines- Shingrix (1 of 2) Never done    Colorectal cancer screening: Type of screening: Colonoscopy. Completed 08/05/2020. Repeat every 3 years  Lung Cancer Screening: (Low Dose CT Chest recommended if Age 69-80 years, 20 pack-year currently smoking OR have quit w/in 15years.) does not qualify.   Lung Cancer Screening Referral: no  Additional Screening:  Hepatitis C Screening: does qualify;   Vision Screening: Recommended annual ophthalmology exams for early detection of glaucoma and other disorders of the eye. Is the patient up to date with their annual eye exam?  Yes  Who is the provider or what is the name of the office in which the patient attends annual eye exams? [facility] If pt is not established with a provider, would they like to be referred to a provider to establish care? No .   Dental Screening: Recommended annual dental exams for proper oral hygiene  Diabetic Foot Exam: n/a  Community Resource Referral / Chronic Care Management: CRR required this visit?  No   CCM required this visit?  No    Plan:     I have personally reviewed and noted the following in the patient's chart:   Medical and social history Use of alcohol, tobacco or illicit drugs  Current medications and supplements including opioid prescriptions. Patient is not currently taking opioid prescriptions. Functional ability and status Nutritional status Physical activity Advanced directives List of other physicians Hospitalizations, surgeries, and ER visits in previous 12 months Vitals Screenings to include cognitive, depression, and falls Referrals and appointments  In addition, I have reviewed and discussed with patient certain preventive protocols, quality metrics, and best practice recommendations. A written personalized care plan  for preventive services as well as general preventive health recommendations were provided to patient.     Barb Merino, LPN   1/61/0960   After Visit Summary: (MyChart) Due to this being a telephonic visit, the after visit summary with patients personalized plan was offered to patient via MyChart   Nurse Notes: none

## 2022-08-25 NOTE — Telephone Encounter (Signed)
Patient declined to make appointment for next year at this time.

## 2022-09-15 DIAGNOSIS — D2261 Melanocytic nevi of right upper limb, including shoulder: Secondary | ICD-10-CM | POA: Diagnosis not present

## 2022-09-15 DIAGNOSIS — L821 Other seborrheic keratosis: Secondary | ICD-10-CM | POA: Diagnosis not present

## 2022-09-15 DIAGNOSIS — L570 Actinic keratosis: Secondary | ICD-10-CM | POA: Diagnosis not present

## 2022-09-15 DIAGNOSIS — D2262 Melanocytic nevi of left upper limb, including shoulder: Secondary | ICD-10-CM | POA: Diagnosis not present

## 2022-09-15 DIAGNOSIS — D1801 Hemangioma of skin and subcutaneous tissue: Secondary | ICD-10-CM | POA: Diagnosis not present

## 2022-09-15 DIAGNOSIS — D2271 Melanocytic nevi of right lower limb, including hip: Secondary | ICD-10-CM | POA: Diagnosis not present

## 2022-10-11 DIAGNOSIS — H52223 Regular astigmatism, bilateral: Secondary | ICD-10-CM | POA: Diagnosis not present

## 2022-10-11 DIAGNOSIS — H2513 Age-related nuclear cataract, bilateral: Secondary | ICD-10-CM | POA: Diagnosis not present

## 2022-10-11 DIAGNOSIS — H53143 Visual discomfort, bilateral: Secondary | ICD-10-CM | POA: Diagnosis not present

## 2022-10-11 DIAGNOSIS — H524 Presbyopia: Secondary | ICD-10-CM | POA: Diagnosis not present

## 2022-10-11 DIAGNOSIS — H5213 Myopia, bilateral: Secondary | ICD-10-CM | POA: Diagnosis not present

## 2022-10-17 ENCOUNTER — Institutional Professional Consult (permissible substitution) (HOSPITAL_BASED_OUTPATIENT_CLINIC_OR_DEPARTMENT_OTHER): Payer: Medicare Other | Admitting: Internal Medicine

## 2022-10-18 ENCOUNTER — Ambulatory Visit (HOSPITAL_BASED_OUTPATIENT_CLINIC_OR_DEPARTMENT_OTHER): Payer: Medicare Other | Admitting: Nurse Practitioner

## 2022-10-18 ENCOUNTER — Encounter (HOSPITAL_BASED_OUTPATIENT_CLINIC_OR_DEPARTMENT_OTHER): Payer: Self-pay

## 2022-10-19 ENCOUNTER — Encounter: Payer: Self-pay | Admitting: Family Medicine

## 2022-10-19 DIAGNOSIS — E782 Mixed hyperlipidemia: Secondary | ICD-10-CM

## 2022-11-08 ENCOUNTER — Ambulatory Visit: Payer: Medicare Other | Admitting: Family Medicine

## 2022-11-09 ENCOUNTER — Ambulatory Visit (INDEPENDENT_AMBULATORY_CARE_PROVIDER_SITE_OTHER): Payer: Medicare Other | Admitting: Family Medicine

## 2022-11-09 ENCOUNTER — Encounter: Payer: Self-pay | Admitting: Family Medicine

## 2022-11-09 VITALS — Temp 97.6°F | Ht 70.0 in | Wt 197.6 lb

## 2022-11-09 DIAGNOSIS — J069 Acute upper respiratory infection, unspecified: Secondary | ICD-10-CM | POA: Insufficient documentation

## 2022-11-09 DIAGNOSIS — M25571 Pain in right ankle and joints of right foot: Secondary | ICD-10-CM | POA: Diagnosis not present

## 2022-11-09 MED ORDER — NAPROXEN 500 MG PO TABS
500.0000 mg | ORAL_TABLET | Freq: Two times a day (BID) | ORAL | 0 refills | Status: DC
Start: 2022-11-09 — End: 2023-10-16

## 2022-11-09 NOTE — Assessment & Plan Note (Signed)
The history and exam are not consistent with gout. I am more suspicious of some ankle arthritis, in part due to his priro ankle trauma. I recommend relative rest, though he likely will stress the joint a bit with his planned golfing over the next week. I will have him use naproxen 500 mg bid for 7 days. I recommend ice after golfing if the joint is painful. I will check an x-ray. If the pain persists, would consider orthopedic referral.

## 2022-11-09 NOTE — Assessment & Plan Note (Signed)
Discussed home care for viral illness, including rest, pushing fluids, and OTC medications as needed for symptom relief. Recommend hot tea with honey for sore throat symptoms. Follow-up if needed for worsening or persistent symptoms.  

## 2022-11-09 NOTE — Progress Notes (Signed)
[facility] [address] [place] [address] Dept: [phone] Dept Fax: [phone]  Office Visit  Subjective:    Patient ID: Clifford Gomez, male    DOB: 1955/04/22, 67 y.o..   MRN: [id number]  Chief Complaint  Patient presents with   Leg Pain    C/o having pain in the lower RT leg x 1 week.   Has been taking his gout medication.   Also having cough, sneezing, running nose and wheezing x 5 days.  Has taken Flonase and cough meds.    History of Present Illness:  Patient is in today with a 1-week history of right ankle pain. He notes that he has this occur periodically. He attributes this to gout attacks and typically responds by starting to take allopurinol 300 mg daily. He has also been taking ibuprofen 400-600 mg twice a day. He notes the ankle pain did not seem to respond to the allopurinol, which he feels has worked in the past. He made the appointment 2 days ago and notes the pain has subsided some in that time. He notes the pain radiates up the lower leg some. He has not seen swelling. Mr. Francom had a distal fibular fracture associated with an ankle sprain in his late teens. He also had prior ankle surgery to grind down bone spurs. He does admit to popping in his ankle on a regular basis, though less so more recently. He does have multiple golfing events planned over the next week.  Mr. Teagle also notes a 5-day history of nasal congestion, sneezing, rhinorrhea, and a cough. He had an episode last night where he feels he was wheezing. He has been using Flonase nasal spray to try and help the nasal symptoms.  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   Allergy to alpha-gal 04/05/2022   Statin intolerance 04/05/2022   Intracranial arachnoid cyst 04/04/2022   Body mass index (BMI) 28.0-28.9, adult 07/22/2019   Essential (primary) hypertension 07/22/2019   Cervical radiculopathy 10/07/2013   History of colonic polyps 04/16/2013   Melanocytic nevi of trunk 05/08/2012   Memory impairment 05/08/2012   Atypical chest pain 05/08/2012   Hyperlipidemia    Past Surgical History:  Procedure Laterality Date   ANKLE SURGERY Right    COLONOSCOPY  2015   JP-MAC-movi(exc)-TA   POLYPECTOMY     TONSILLECTOMY     WISDOM TOOTH EXTRACTION     Family History  Problem Relation Age of Onset   Hyperlipidemia Mother    Dementia Mother    Hypertension Father    Hyperlipidemia Father    Breast cancer Sister    Hypertension Sister    Cancer Brother        prostrate   Hypertension Brother    Stroke Paternal Aunt    Cancer Maternal Grandmother    Diabetes Maternal Grandfather    Heart disease Maternal Grandfather    Alcohol abuse Paternal Grandfather    Colon cancer Neg Hx    Stomach cancer Neg Hx    Colon polyps Neg Hx    Esophageal cancer Neg Hx    Rectal cancer  Neg Hx    Outpatient Medications Prior to Visit  Medication Sig Dispense Refill   allopurinol (ZYLOPRIM) 300 MG tablet Take 300 mg by mouth daily.     cetirizine (ZYRTEC) 10 MG tablet Take 1 tablet by mouth daily as needed.     diphenhydrAMINE (BENADRYL) 25 mg capsule Take 1 capsule by mouth daily as needed.     IBUPROFEN IB PO Take 1 tablet by mouth daily as needed.     pantoprazole (PROTONIX) 40 MG tablet Take 1 tablet (40 mg total) by mouth daily as needed. 30 tablet 5   tadalafil (CIALIS) 20 MG tablet Take 1 tablet (20 mg total) by mouth daily as needed for erectile dysfunction. 10 tablet 2   valACYclovir (VALTREX) 1000 MG tablet      No facility-administered medications prior to  visit.   Allergies  Allergen Reactions   Ezetimibe Other (See Comments)   Pravastatin Other (See Comments)    Muscle cramps   Simvastatin Other (See Comments)    Muscle cramps     Objective:   Today's Vitals   11/09/22 0802  Temp: 97.6 F (36.4 C)  TempSrc: Temporal  Weight: 197 lb 9.6 oz (89.6 kg)  Height: 5\' 10"  (1.778 m)   Body mass index is 28.35 kg/m.   General: Well developed, well nourished. No acute distress. Lungs: Clear to auscultation bilaterally. I heard one faint wheeze int he lower left chest. No rales or rhonchi. Extremities: The right ankle has full ROM. There is no redness or swelling of the joint. There is no pain on   palpation. Tilt and Drawer testing are normal. No lower leg edema. Skin: Warm and dry. No rashes. Psych: Alert and oriented. Normal mood and affect.  Health Maintenance Due  Topic Date Due   Zoster Vaccines- Shingrix (1 of 2) Never done     Assessment & Plan:   Problem List Items Addressed This Visit       Respiratory   Viral URI with cough    Discussed home care for viral illness, including rest, pushing fluids, and OTC medications as needed for symptom relief. Recommend hot tea with honey for sore throat symptoms. Follow-up if needed for worsening or persistent symptoms.         Other   Acute right ankle pain - Primary    The history and exam are not consistent with gout. I am more suspicious of some ankle arthritis, in part due to his priro ankle trauma. I recommend relative rest, though he likely will stress the joint a bit with his planned golfing over the next week. I will have him use naproxen 500 mg bid for 7 days. I recommend ice after golfing if the joint is painful. I will check an x-ray. If the pain persists, would consider orthopedic referral.      Relevant Medications   naproxen (NAPROSYN) 500 MG tablet   Other Relevant Orders   DG Ankle Complete Right    Return if symptoms worsen or fail to improve.   Loyola Mast, MD

## 2023-01-12 NOTE — Progress Notes (Unsigned)
CARDIOLOGY CONSULT NOTE       Patient ID: Clifford Gomez MRN: [id number] DOB/AGE: 12-Dec-1955 67 y.o.  Admit date: (Not on file) Referring Physician: Rudd Primary Physician: Loyola Mast, MD Primary Cardiologist: New Reason for Consultation: Lipids  Active Problems:   * No active hospital problems. *   HPI:  67 y.o. referred by Dr Veto Kemps for lipid assessment. Was supposed to see Dr Rennis Golden in our advanced lipid clinic  Seems like he has been intolerant to pravastatin, simvastatin and zetia. SR   Labs 04/05/22 showed LDL 174 triglycerides 72 and TC 246 He has no documented history of vascular dx Chronic right ankle pain with previous fracture and ? Arthritis/gout  Seen in ED 05/08/22 for chest pain. Atypical intermittent Left axilla and central chest radiating to top left shoulder R/O no acute ECG changes troponin negative x 4 CXR NAD   He never f/u for a stress test. He has no chest pain. 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 He knows my patient/friend Education administrator  ROS All other systems reviewed and negative except as noted above  Past Medical History:  Diagnosis Date   Allergy to alpha-gal    Anxiety    Arthritis    joints   BMI 28.0-28.9,adult    Cervical (neck) region somatic dysfunction    Cervical paraspinal muscle spasm    Cervical radiculopathy    Chest pain    Depression    Diarrhea    ED (erectile dysfunction)    Fainting spell    10 years ago   GERD (gastroesophageal reflux disease)    Gout    History of colonic polyps    HSV-1 infection    Hyperlipidemia    on meds   Hypertension    on meds   Intracranial arachnoid cyst    Low back pain    Melanocytic nevi of trunk    Memory impairment    Ocular migraine    Olecranon bursitis, right elbow    Perennial allergic rhinitis    Scapular dyskinesis    Seasonal allergies    Sensorineural hearing loss (SNHL) of left ear    Statin intolerance      Family History  Problem Relation Age of Onset   Hyperlipidemia Mother    Dementia Mother    Hypertension Father    Hyperlipidemia Father    Breast cancer Sister    Hypertension Sister    Cancer Brother        prostrate   Hypertension Brother    Stroke Paternal Aunt    Cancer Maternal Grandmother    Diabetes Maternal Grandfather    Heart disease Maternal Grandfather    Alcohol abuse Paternal Grandfather    Colon cancer Neg Hx    Stomach cancer Neg Hx    Colon polyps Neg Hx    Esophageal cancer Neg Hx    Rectal cancer Neg Hx     Social History   Socioeconomic History   Marital status: Significant Other    Spouse name: Not on file   Number of children: 0   Years of education: Not on file   Highest education level: Bachelor's degree (e.g., BA, AB, BS)  Occupational History   Occupation: Psychologist, sport and exercise    Comment: Careers adviser, Personal assistant  Tobacco Use   Smoking status: Never   Smokeless tobacco: Never  Vaping Use   Vaping status:  Never Used  Substance and Sexual Activity   Alcohol use: Yes    Alcohol/week: 16.0 standard drinks of alcohol    Types: 16 Standard drinks or equivalent per week    Comment: occasional   Drug use: No   Sexual activity: Yes  Other Topics Concern   Not on file  Social History Narrative   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   Social Drivers of Corporate investment banker Strain: Low Risk  (08/25/2022)   Overall Financial Resource Strain (CARDIA)    Difficulty of Paying Living Expenses: Not hard at all  Food Insecurity: No Food Insecurity (08/25/2022)   Hunger Vital Sign    Worried About Running Out of Food in the Last Year: Never true    Ran Out of Food in the Last Year: Never true  Transportation Needs: No Transportation Needs (08/25/2022)   PRAPARE - Administrator, Civil Service (Medical): No    Lack of Transportation (Non-Medical): No  Physical Activity: Sufficiently Active (08/25/2022)   Exercise Vital Sign    Days  of Exercise per Week: 4 days    Minutes of Exercise per Session: 90 min  Stress: No Stress Concern Present (08/25/2022)   [employer] of Occupational Health - Occupational Stress Questionnaire    Feeling of Stress : Not at all  Social Connections: Unknown (08/25/2022)   Social Connection and Isolation Panel [NHANES]    Frequency of Communication with Friends and Family: More than three times a week    Frequency of Social Gatherings with Friends and Family: More than three times a week    Attends Religious Services: Patient declined    Database administrator or Organizations: No    Attends Banker Meetings: Patient declined    Marital Status: Patient declined  Intimate Partner Violence: Not At Risk (08/25/2022)   Humiliation, Afraid, Rape, and Kick questionnaire    Fear of Current or Ex-Partner: No    Emotionally Abused: No    Physically Abused: No    Sexually Abused: No    Past Surgical History:  Procedure Laterality Date   ANKLE SURGERY Right    COLONOSCOPY  2015   JP-MAC-movi(exc)-TA   POLYPECTOMY     TONSILLECTOMY     WISDOM TOOTH EXTRACTION        Current Outpatient Medications:    allopurinol (ZYLOPRIM) 300 MG tablet, Take 300 mg by mouth daily., Disp: , Rfl:    cetirizine (ZYRTEC) 10 MG tablet, Take 1 tablet by mouth daily as needed., Disp: , Rfl:    diphenhydrAMINE (BENADRYL) 25 mg capsule, Take 1 capsule by mouth daily as needed., Disp: , Rfl:    IBUPROFEN IB PO, Take 1 tablet by mouth daily as needed., Disp: , Rfl:    naproxen (NAPROSYN) 500 MG tablet, Take 1 tablet (500 mg total) by mouth 2 (two) times daily with a meal., Disp: 14 tablet, Rfl: 0   pantoprazole (PROTONIX) 40 MG tablet, Take 1 tablet (40 mg total) by mouth daily as needed., Disp: 30 tablet, Rfl: 5   tadalafil (CIALIS) 20 MG tablet, Take 1 tablet (20 mg total) by mouth daily as needed for erectile dysfunction., Disp: 10 tablet, Rfl: 2   valACYclovir (VALTREX) 1000 MG tablet, , Disp: ,  Rfl:     Physical Exam: Blood pressure 120/78, pulse 77, height 5\' 10"  (1.778 m), weight 204 lb 9.6 oz (92.8 kg), SpO2 98%.    Affect appropriate Healthy:  appears stated age HEENT: normal  Neck supple with no adenopathy JVP normal no bruits no thyromegaly Lungs clear with no wheezing and good diaphragmatic motion Heart:  S1/S2 no murmur, no rub, gallop or click PMI normal Abdomen: benighn, BS positve, no tenderness, no AAA no bruit.  No HSM or HJR Distal pulses intact with no bruits No edema Neuro non-focal Skin warm and dry No muscular weakness   Labs:   Lab Results  Component Value Date   WBC 5.5 05/08/2022   HGB 14.4 05/08/2022   HCT 41.3 05/08/2022   MCV 86.8 05/08/2022   PLT 198 05/08/2022   No results for input(s): "NA", "K", "CL", "CO2", "BUN", "CREATININE", "CALCIUM", "PROT", "BILITOT", "ALKPHOS", "ALT", "AST", "GLUCOSE" in the last 168 hours.  Invalid input(s): "LABALBU" No results found for: "CKTOTAL", "CKMB", "CKMBINDEX", "TROPONINI"  Lab Results  Component Value Date   CHOL 246 (H) 04/05/2022   CHOL 250 (A) 04/30/2012   Lab Results  Component Value Date   HDL 57.30 04/05/2022   HDL 44 04/30/2012   Lab Results  Component Value Date   LDLCALC 174 (H) 04/05/2022   LDLCALC 181 04/30/2012   Lab Results  Component Value Date   TRIG 72.0 04/05/2022   TRIG 105 04/30/2012   Lab Results  Component Value Date   CHOLHDL 4 04/05/2022   No results found for: "LDLDIRECT"    Radiology: No results found.  EKG: SR rate 74 normal 05/08/22   ASSESSMENT AND PLAN:   Chest pain: resolved R/O in ER Normal ECG/CXR. Shared decision making favor ETT and calcium score to further risk stratify HLD:  likely familial Intolerant to statins/zetia Refer to lipid clinic to start PSK9 Arthritis:  Xray of right ankle still pending Ok to use NSAI's Primary doubts this is related to gout but patient has used allopurinol for pain in past   ETT Calcium score Start PSK9  f/u lipid clinic  Signed: Charlton Haws 01/18/2023, 9:47 AM

## 2023-01-18 ENCOUNTER — Ambulatory Visit: Payer: Medicare Other | Attending: Cardiovascular Disease | Admitting: Cardiovascular Disease

## 2023-01-18 ENCOUNTER — Encounter: Payer: Self-pay | Admitting: Cardiovascular Disease

## 2023-01-18 VITALS — BP 120/78 | HR 77 | Ht 70.0 in | Wt 204.6 lb

## 2023-01-18 DIAGNOSIS — R079 Chest pain, unspecified: Secondary | ICD-10-CM | POA: Insufficient documentation

## 2023-01-18 DIAGNOSIS — I10 Essential (primary) hypertension: Secondary | ICD-10-CM | POA: Diagnosis not present

## 2023-01-18 DIAGNOSIS — E785 Hyperlipidemia, unspecified: Secondary | ICD-10-CM | POA: Diagnosis not present

## 2023-01-18 MED ORDER — PRALUENT 150 MG/ML ~~LOC~~ SOAJ: 150.0000 mg | SUBCUTANEOUS | 12 refills | Status: DC

## 2023-01-18 NOTE — Patient Instructions (Addendum)
Medication Instructions:  Your physician recommends that you continue on your current medications as directed. Please refer to the Current Medication list given to you today.  *If you need a refill on your cardiac medications before your next appointment, please call your pharmacy*  Lab Work: If you have labs (blood work) drawn today and your tests are completely normal, you will receive your results only by: MyChart Message (if you have MyChart) OR A paper copy in the mail If you have any lab test that is abnormal or we need to change your treatment, we will call you to review the results.  Testing/Procedures: Your physician has requested that you have an exercise tolerance test. For further information please visit [email or web address] Please also follow instruction sheet, as given.  CT scanning for a cardiac calcium score (CAT scanning), is a noninvasive, special x-ray that produces cross-sectional images of the body using x-rays and a computer. CT scans help physicians diagnose and treat medical conditions. For some CT exams, a contrast material is used to enhance visibility in the area of the body being studied. CT scans provide greater clarity and reveal more details than regular x-ray exams.   Follow-Up: At [facility], you and your health needs are our priority.  As part of our continuing mission to provide you with exceptional heart care, we have created designated Provider Care Teams.  These Care Teams include your primary Cardiologist (physician) and Advanced Practice Providers (APPs -  Physician Assistants and Nurse Practitioners) who all work together to provide you with the care you need, when you need it.  We recommend signing up for the patient portal called "MyChart".  Sign up information is provided on this After Visit Summary.  MyChart is used to connect with patients for Virtual Visits (Telemedicine).  Patients are able to view lab/test results, encounter notes,  upcoming appointments, etc.  Non-urgent messages can be sent to your provider as well.   To learn more about what you can do with MyChart, go to [email or web address].    Your next appointment:   6 month(s)  Provider:   Charlton Haws, MD     You have been referred to Lipid Clinic in 6 weeks

## 2023-01-19 ENCOUNTER — Telehealth: Payer: Self-pay | Admitting: Pharmacy Technician

## 2023-01-19 ENCOUNTER — Telehealth: Payer: Self-pay | Admitting: Cardiovascular Disease

## 2023-01-19 ENCOUNTER — Other Ambulatory Visit (HOSPITAL_COMMUNITY): Payer: Self-pay

## 2023-01-19 NOTE — Telephone Encounter (Signed)
Pt c/o medication issue:  1. Name of Medication:   Alirocumab (PRALUENT) 150 MG/ML SOAJ   2. How are you currently taking this medication (dosage and times per day)?   3. Are you having a reaction (difficulty breathing--STAT)?   4. What is your medication issue?   Caller Steward Drone) is following-up on patient's prior authorization for this medication.   Caller stated request was faxed and provided reference# - [id number] and store# [id number].

## 2023-01-19 NOTE — Telephone Encounter (Signed)
Pharmacy Patient Advocate Encounter  Received notification from [facility] that Prior Authorization for praluent has been APPROVED from 02/06/22 to 02/06/24. Ran test claim, Copay is $476.39- one month (DEDUCTIBLE). This test claim was processed through [facility]- copay amounts may vary at other pharmacies due to pharmacy/plan contracts, or as the patient moves through the different stages of their insurance plan.   PA #/Case ID/Reference #: [id number]

## 2023-01-19 NOTE — Telephone Encounter (Signed)
Pharmacy Patient Advocate Encounter   Received notification from Pt Calls Messages that prior authorization for praluent is required/requested.   Insurance verification completed.   The patient is insured through [employer] .   Per test claim: PA required; PA submitted to above mentioned insurance via CoverMyMeds Key/confirmation #/EOC [id number] Status is pending

## 2023-01-29 ENCOUNTER — Telehealth: Payer: Self-pay

## 2023-01-29 DIAGNOSIS — E785 Hyperlipidemia, unspecified: Secondary | ICD-10-CM

## 2023-01-29 DIAGNOSIS — I10 Essential (primary) hypertension: Secondary | ICD-10-CM

## 2023-01-29 DIAGNOSIS — R079 Chest pain, unspecified: Secondary | ICD-10-CM

## 2023-01-29 NOTE — Telephone Encounter (Signed)
-----   Message from Flavia Shipper sent at 01/26/2023  8:35 AM EST ----- Regarding: attestation order Im missing the attestation order on this patient for his treadmill. Can you enter and have Nishan sign it.  Thanks, [employer]

## 2023-02-08 ENCOUNTER — Ambulatory Visit: Payer: Medicare Other | Attending: Cardiovascular Disease

## 2023-02-08 DIAGNOSIS — R079 Chest pain, unspecified: Secondary | ICD-10-CM | POA: Insufficient documentation

## 2023-02-08 DIAGNOSIS — I10 Essential (primary) hypertension: Secondary | ICD-10-CM | POA: Diagnosis not present

## 2023-02-08 DIAGNOSIS — E785 Hyperlipidemia, unspecified: Secondary | ICD-10-CM | POA: Diagnosis not present

## 2023-02-08 LAB — EXERCISE TOLERANCE TEST
Angina Index: 0
Duke Treadmill Score: 9
Estimated workload: 10.1
Exercise duration (min): 9 min
Exercise duration (sec): 0 s
MPHR: 153 {beats}/min
Peak HR: 153 {beats}/min
Percent HR: 100 %
RPE: 16
Rest HR: 88 {beats}/min
ST Depression (mm): 0 mm

## 2023-02-08 NOTE — Progress Notes (Unsigned)
 Patient aware of results.

## 2023-03-01 ENCOUNTER — Ambulatory Visit: Payer: Medicare Other | Attending: Student | Admitting: Student

## 2023-03-01 ENCOUNTER — Encounter: Payer: Self-pay | Admitting: Student

## 2023-03-01 DIAGNOSIS — E7849 Other hyperlipidemia: Secondary | ICD-10-CM | POA: Diagnosis not present

## 2023-03-01 MED ORDER — ROSUVASTATIN CALCIUM 5 MG PO TABS: 5.0000 mg | ORAL_TABLET | Freq: Every day | ORAL | 3 refills | Status: DC

## 2023-03-01 NOTE — Patient Instructions (Signed)
Your Results:             Your most recent labs Goal  Total Cholesterol 246 < 200  Triglycerides 72 < 150  HDL (happy/good cholesterol) 57.30 > 40  LDL (lousy/bad cholesterol) 174 < 70   Medication changes: Start taking Praluent 150 mg every 14 days under the skin. After 2 injections try rosuvastatin 5 mg daily if not tolerated try every other day or 3 times per week.     Praluent is a cholesterol medication that improved your body's ability to get rid of "bad cholesterol" known as LDL. It can lower your LDL up to 60%. It is an injection that is given under the skin every 2 weeks. The most common side effects of Praluent include runny nose, symptoms of the common cold, rarely flu or flu-like symptoms, back/muscle pain in about 3-4% of the patients, and redness, pain, or bruising at the injection site.     Lab orders: We want to repeat labs after 2-3 months.  We will send you a lab order to remind you once we get closer to that time.

## 2023-03-01 NOTE — Assessment & Plan Note (Signed)
Assessment and Plan:   LDL goal: <70 mg/dl last LDLc 161 mg/dl TC 096  Has not started taking Praluent due to high cost and deductible  Educate pt to opt in to monthly payment plan with insurance to break deductible in more affordable amount - patient will call his insurance to learn more about that  Patient thinks he wont qualify for [employer] - may not meet income criteria  Intolerance to statins multiple statins including Crestor 10 mg and Zetia 10 mg daily  Discussed benefit of low dose Crestor and reviewed  Praluent LDLc lowering effect, common side effect and  correct administration steps.  Given hx of gout nexletol may not be good option  Patient will call us back if Praluent is cost prohibitive  Willing to try Crestor 5 mg every other day or twice a week along with Praluent 150 mg [place] every 14 days    Will repeat lab in 2-3 months of therapy modifications

## 2023-03-01 NOTE — Progress Notes (Signed)
Patient ID: Clifford Gomez                 DOB: Dec 20, 1955                    MRN: [id number]      HPI: Clifford Gomez is a 68 y.o. male patient referred to lipid clinic by Dr.Nishan. PMH is significant for angina, HLD, arthritis, hypertension, GERD.   intolerant to pravastatin, simvastatin and zetia. Last LDLc 174, TC 246. Patient was prescribed Praluent 150 mg Q14d , PA is approved but co-pay is high due to deductible.  Today patient presented for lipid clinic. Reports he has tried multiple statins including pitavastatin, simvastatin and rosuvastatin 10 mg daily in the past. Due to muscle cramps and joint pain he was unable to tolerate them. Dr. Eden Emms prescribed Praluent but he could not afford due to high deductible. Educated patient about monthly deductible plan and advised him to call his insurance to opt in to one so that $2000 yearly deductible will be broken down in to affordable monthly payments and that would make medications ( brand name especially) be more affordable. Reviewed Praluent LDLc lowering effect, common side effect and  correct administration steps.  Discussed [employer] grant to make Praluent more affordable however patient thinks he would not meet their income criteria.   We discussed benefits of statins and he is willing to try Crestor 5 mg and he will self adjust the dose.   Current Medications: Praluent 150 mg every 14 days  Intolerances: unable to tolerate pitavastatin, rosuvastatin 10 mg simvastatin and ezetimibe due to myalgia and joint pain   Risk Factors: angina, HLD, hypertension LDL goal: <70 mg/dl   Diet: lean meat, and healthy small meals   Exercise: golf during summer. Stays busy during the day   Family History:  Relation Problem Comments  Mother (Deceased) Dementia   Hyperlipidemia     Father Metallurgist) Hyperlipidemia   Hypertension     Sister Metallurgist) Breast cancer     Sister Metallurgist) Hypertension     Sister Metallurgist)   Brother Metallurgist)   Brother (Alive)  Cancer prostrate  Hypertension     Paternal Aunt Stroke     Maternal Grandmother Cancer     Maternal Grandfather Diabetes   Heart disease     Paternal Grandfather Alcohol abuse       Labs: Lipid Panel     Component Value Date/Time   CHOL 246 (H) 04/05/2022 0912   TRIG 72.0 04/05/2022 0912   HDL 57.30 04/05/2022 0912   CHOLHDL 4 04/05/2022 0912   VLDL 14.4 04/05/2022 0912   LDLCALC 174 (H) 04/05/2022 0912    Past Medical History:  Diagnosis Date   Allergy to alpha-gal    Anxiety    Arthritis    joints   BMI 28.0-28.9,adult    Cervical (neck) region somatic dysfunction    Cervical paraspinal muscle spasm    Cervical radiculopathy    Chest pain    Depression    Diarrhea    ED (erectile dysfunction)    Fainting spell    10 years ago   GERD (gastroesophageal reflux disease)    Gout    History of colonic polyps    HSV-1 infection    Hyperlipidemia    on meds   Hypertension    on meds   Intracranial arachnoid cyst    Low back pain    Melanocytic nevi of trunk    Memory  impairment    Ocular migraine    Olecranon bursitis, right elbow    Perennial allergic rhinitis    Scapular dyskinesis    Seasonal allergies    Sensorineural hearing loss (SNHL) of left ear    Statin intolerance     Current Outpatient Medications on File Prior to Visit  Medication Sig Dispense Refill   Alirocumab (PRALUENT) 150 MG/ML SOAJ Inject 1 mL (150 mg total) into the skin every 14 (fourteen) days. 2 mL 12   allopurinol (ZYLOPRIM) 300 MG tablet Take 300 mg by mouth daily.     cetirizine (ZYRTEC) 10 MG tablet Take 1 tablet by mouth daily as needed.     diphenhydrAMINE (BENADRYL) 25 mg capsule Take 1 capsule by mouth daily as needed.     IBUPROFEN IB PO Take 1 tablet by mouth daily as needed.     naproxen (NAPROSYN) 500 MG tablet Take 1 tablet (500 mg total) by mouth 2 (two) times daily with a meal. 14 tablet 0   pantoprazole (PROTONIX) 40 MG tablet Take 1 tablet (40 mg total) by  mouth daily as needed. 30 tablet 5   tadalafil (CIALIS) 20 MG tablet Take 1 tablet (20 mg total) by mouth daily as needed for erectile dysfunction. 10 tablet 2   valACYclovir (VALTREX) 1000 MG tablet      No current facility-administered medications on file prior to visit.    Allergies  Allergen Reactions   Ezetimibe Other (See Comments)   Pravastatin Other (See Comments)    Muscle cramps   Simvastatin Other (See Comments)    Muscle cramps    Assessment/Plan:  1. Hyperlipidemia -  Problem  Hyperlipidemia   Current Medications: Praluent 150 mg every 14 days and Crestor 5 mg every other day or twice a week  Intolerances: unable to tolerate pitavastatin, rosuvastatin 10 mg simvastatin and ezetimibe due to myalgia and joint pain   Risk Factors: angina, HLD, hypertension LDL goal: <70 mg/dl     Hyperlipidemia Assessment and Plan:   LDL goal: <70 mg/dl last LDLc 409 mg/dl TC 811  Has not started taking Praluent due to high cost and deductible  Educate pt to opt in to monthly payment plan with insurance to break deductible in more affordable amount - patient will call his insurance to learn more about that  Patient thinks he wont qualify for [employer] - may not meet income criteria  Intolerance to statins multiple statins including Crestor 10 mg and Zetia 10 mg daily  Discussed benefit of low dose Crestor and reviewed  Praluent LDLc lowering effect, common side effect and  correct administration steps.  Given hx of gout nexletol may not be good option  Patient will call us back if Praluent is cost prohibitive  Willing to try Crestor 5 mg every other day or twice a week along with Praluent 150 mg  every 14 days    Will repeat lab in 2-3 months of therapy modifications      Thank you,  Carmela Hurt, Pharm.D [facility] A Division of [facility] [address] [place], [place] [address]  Phone: [phone]; Fax: [phone]

## 2023-03-08 ENCOUNTER — Ambulatory Visit (HOSPITAL_BASED_OUTPATIENT_CLINIC_OR_DEPARTMENT_OTHER)
Admission: RE | Admit: 2023-03-08 | Discharge: 2023-03-08 | Disposition: A | Discharge status: Home / Self Care | Payer: Self-pay | Source: Ambulatory Visit | Attending: Cardiovascular Disease | Admitting: Cardiovascular Disease

## 2023-03-08 DIAGNOSIS — E785 Hyperlipidemia, unspecified: Secondary | ICD-10-CM | POA: Insufficient documentation

## 2023-03-08 DIAGNOSIS — R079 Chest pain, unspecified: Secondary | ICD-10-CM | POA: Insufficient documentation

## 2023-03-08 DIAGNOSIS — I10 Essential (primary) hypertension: Secondary | ICD-10-CM | POA: Insufficient documentation

## 2023-05-02 ENCOUNTER — Other Ambulatory Visit (HOSPITAL_COMMUNITY): Payer: Self-pay

## 2023-07-05 ENCOUNTER — Telehealth: Payer: Self-pay

## 2023-07-05 NOTE — Progress Notes (Signed)
 CARDIOLOGY CONSULT NOTE       Patient ID: Clifford Gomez MRN: [id number] DOB/AGE: Dec 09, 1955 68 y.o.  Referring Physician: Therese Flash Primary Physician: Graig Lawyer, MD Primary Cardiologist: Stann Earnest Reason for Consultation: Lipids    HPI:  69 y.o. referred by Dr Therese Flash for lipid assessment. First seen 01/2023  Was supposed to see Dr Maximo Spar in our advanced lipid clinic  Seems like he has been intolerant to crestor  10 mg, pravastatin, simvastatin and zetia. SR   Labs 04/05/22 showed LDL 174 triglycerides 72 and TC 246 He has no documented history of vascular dx Chronic right ankle pain with previous fracture and ? Arthritis/gout  Seen in ED 05/08/22 for chest pain. Atypical intermittent Left axilla and central chest radiating to top left shoulder R/O no acute ECG changes troponin negative x 4 CXR NAD   He never f/u for a stress test. He has no chest pain. 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 He knows my patient/friend Hunter Bacot  ETT 02/08/23 normal no ischemia Calcium  Score 03/08/23 49.3 involving LAD/RCA 39 th percentile  Seen by Pharm D 03/01/23 concern for high deductible for Praluent   And also supposed to started on 5 mg crestor    Golfing and going to [place] Has not started crestor  yet No issues with PSK9  ROS All other systems reviewed and negative except as noted above  Past Medical History:  Diagnosis Date   Allergy to alpha-gal    Anxiety    Arthritis    joints   BMI 28.0-28.9,adult    Cervical (neck) region somatic dysfunction    Cervical paraspinal muscle spasm    Cervical radiculopathy    Chest pain    Depression    Diarrhea    ED (erectile dysfunction)    Fainting spell    10 years ago   GERD (gastroesophageal reflux disease)    Gout    History of colonic polyps    HSV-1 infection    Hyperlipidemia    on meds   Hypertension    on meds   Intracranial arachnoid cyst    Low back pain    Melanocytic  nevi of trunk    Memory impairment    Ocular migraine    Olecranon bursitis, right elbow    Perennial allergic rhinitis    Scapular dyskinesis    Seasonal allergies    Sensorineural hearing loss (SNHL) of left ear    Statin intolerance     Family History  Problem Relation Age of Onset   Hyperlipidemia Mother    Dementia Mother    Hypertension Father    Hyperlipidemia Father    Breast cancer Sister    Hypertension Sister    Cancer Brother        prostrate   Hypertension Brother    Stroke Paternal Aunt    Cancer Maternal Grandmother    Diabetes Maternal Grandfather    Heart disease Maternal Grandfather    Alcohol abuse Paternal Grandfather    Colon cancer Neg Hx    Stomach cancer Neg Hx    Colon polyps Neg Hx    Esophageal cancer Neg Hx    Rectal cancer Neg Hx     Social History   Socioeconomic History   Marital status: Significant Other    Spouse name: Not on file   Number of children: 0   Years of education: Not on file   Highest education  level: Bachelor's degree (e.g., BA, AB, BS)  Occupational History   Occupation: Psychologist, sport and exercise    Comment: Careers adviser, Personal assistant  Tobacco Use   Smoking status: Never   Smokeless tobacco: Never  Vaping Use   Vaping status: Never Used  Substance and Sexual Activity   Alcohol use: Yes    Alcohol/week: 16.0 standard drinks of alcohol    Types: 16 Standard drinks or equivalent per week    Comment: occasional   Drug use: No   Sexual activity: Yes  Other Topics Concern   Not on file  Social History Narrative   Guardian for a 68 year-old   Social Drivers of Corporate investment banker Strain: Low Risk  (08/25/2022)   Overall Financial Resource Strain (CARDIA)    Difficulty of Paying Living Expenses: Not hard at all  Food Insecurity: No Food Insecurity (08/25/2022)   Hunger Vital Sign    Worried About Running Out of Food in the Last Year: Never true    Ran Out of Food in the Last Year: Never true   Transportation Needs: No Transportation Needs (08/25/2022)   PRAPARE - Administrator, Civil Service (Medical): No    Lack of Transportation (Non-Medical): No  Physical Activity: Sufficiently Active (08/25/2022)   Exercise Vital Sign    Days of Exercise per Week: 4 days    Minutes of Exercise per Session: 90 min  Stress: No Stress Concern Present (08/25/2022)   [employer] of Occupational Health - Occupational Stress Questionnaire    Feeling of Stress : Not at all  Social Connections: Unknown (08/25/2022)   Social Connection and Isolation Panel    Frequency of Communication with Friends and Family: More than three times a week    Frequency of Social Gatherings with Friends and Family: More than three times a week    Attends Religious Services: Patient declined    Database administrator or Organizations: No    Attends Banker Meetings: Patient declined    Marital Status: Patient declined  Intimate Partner Violence: Not At Risk (08/25/2022)   Humiliation, Afraid, Rape, and Kick questionnaire    Fear of Current or Ex-Partner: No    Emotionally Abused: No    Physically Abused: No    Sexually Abused: No    Past Surgical History:  Procedure Laterality Date   ANKLE SURGERY Right    COLONOSCOPY  2015   JP-MAC-movi(exc)-TA   POLYPECTOMY     TONSILLECTOMY     WISDOM TOOTH EXTRACTION        Current Outpatient Medications:    Alirocumab  (PRALUENT ) 150 MG/ML SOAJ, Inject 1 mL (150 mg total) into the skin every 14 (fourteen) days., Disp: 2 mL, Rfl: 12   amoxicillin -clavulanate (AUGMENTIN ) 875-125 MG tablet, Take 1 tablet by mouth 2 (two) times daily., Disp: 20 tablet, Rfl: 0   cetirizine (ZYRTEC) 10 MG tablet, Take 1 tablet by mouth daily as needed., Disp: , Rfl:    chlorpheniramine-HYDROcodone (TUSSIONEX) 10-8 MG/5ML, Take 5 mLs by mouth every 12 (twelve) hours as needed for cough., Disp: 70 mL, Rfl: 0   diphenhydrAMINE (BENADRYL) 25 mg capsule, Take 1 capsule  by mouth daily as needed., Disp: , Rfl:    IBUPROFEN IB PO, Take 1 tablet by mouth daily as needed., Disp: , Rfl:    tadalafil  (CIALIS ) 20 MG tablet, Take 1 tablet (20 mg total) by mouth daily as needed for erectile dysfunction., Disp: 10 tablet, Rfl: 2  valACYclovir (VALTREX) 1000 MG tablet, , Disp: , Rfl:    allopurinol (ZYLOPRIM) 300 MG tablet, Take 300 mg by mouth daily. (Patient not taking: Reported on 07/19/2023), Disp: , Rfl:    naproxen  (NAPROSYN ) 500 MG tablet, Take 1 tablet (500 mg total) by mouth 2 (two) times daily with a meal. (Patient not taking: Reported on 07/19/2023), Disp: 14 tablet, Rfl: 0   pantoprazole  (PROTONIX ) 40 MG tablet, Take 1 tablet (40 mg total) by mouth daily as needed., Disp: 30 tablet, Rfl: 5    Physical Exam: Blood pressure 138/80, pulse 74, height 5' 10 (1.778 m), weight 205 lb (93 kg), SpO2 94%.    Affect appropriate Healthy:  appears stated age HEENT: normal Neck supple with no adenopathy JVP normal no bruits no thyromegaly Lungs clear with no wheezing and good diaphragmatic motion Heart:  S1/S2 no murmur, no rub, gallop or click PMI normal Abdomen: benighn, BS positve, no tenderness, no AAA no bruit.  No HSM or HJR Distal pulses intact with no bruits No edema Neuro non-focal Skin warm and dry No muscular weakness   Labs:   Lab Results  Component Value Date   WBC 5.5 05/08/2022   HGB 14.4 05/08/2022   HCT 41.3 05/08/2022   MCV 86.8 05/08/2022   PLT 198 05/08/2022   No results for input(s): NA, K, CL, CO2, BUN, CREATININE, CALCIUM , PROT, BILITOT, ALKPHOS, ALT, AST, GLUCOSE in the last 168 hours.  Invalid input(s): LABALBU No results found for: CKTOTAL, CKMB, CKMBINDEX, TROPONINI  Lab Results  Component Value Date   CHOL 246 (H) 04/05/2022   CHOL 250 (A) 04/30/2012   Lab Results  Component Value Date   HDL 57.30 04/05/2022   HDL 44 04/30/2012   Lab Results  Component Value Date   LDLCALC 174  (H) 04/05/2022   LDLCALC 181 04/30/2012   Lab Results  Component Value Date   TRIG 72.0 04/05/2022   TRIG 105 04/30/2012   Lab Results  Component Value Date   CHOLHDL 4 04/05/2022   No results found for: LDLDIRECT    Radiology: No results found.  EKG: SR rate 74 normal 05/08/22   ASSESSMENT AND PLAN:   Chest pain: resolved R/O in ER Normal ECG/CXR.  Normal ETT 02/08/23 Calcium  score below average for age see below regarding cholesterol  HLD:  likely familial Intolerant to statins/zetia Supposed to be on Praluent  and low dose crestor  repeat labs in 3 months  Arthritis:  Xray of right ankle still pending Ok to use NSAI's Primary doubts this is related to gout but patient has used allopurinol for pain in past   Lipid/Liver in 3 months   Signed: Janelle Mediate 07/19/2023, 8:55 AM

## 2023-07-05 NOTE — Telephone Encounter (Signed)
 Left message for patient to call back

## 2023-07-05 NOTE — Telephone Encounter (Signed)
-----   Message from Janelle Mediate sent at 07/05/2023  2:26 PM EDT ----- Was he able to get Praluent  and taking crestor  5 mg ? Twice/week Needs f/u lipid and liver

## 2023-07-10 ENCOUNTER — Ambulatory Visit: Admitting: Nurse Practitioner

## 2023-07-10 ENCOUNTER — Encounter: Payer: Self-pay | Admitting: Nurse Practitioner

## 2023-07-10 VITALS — BP 116/74 | HR 84 | Temp 98.6°F | Ht 70.0 in | Wt 203.7 lb

## 2023-07-10 DIAGNOSIS — J020 Streptococcal pharyngitis: Secondary | ICD-10-CM

## 2023-07-10 DIAGNOSIS — J069 Acute upper respiratory infection, unspecified: Secondary | ICD-10-CM | POA: Diagnosis not present

## 2023-07-10 LAB — POCT RAPID STREP A (OFFICE): Rapid Strep A Screen: POSITIVE — AB

## 2023-07-10 MED ORDER — AMOXICILLIN-POT CLAVULANATE 875-125 MG PO TABS: 1.0000 | ORAL_TABLET | Freq: Two times a day (BID) | ORAL | 0 refills | Status: DC

## 2023-07-10 MED ORDER — HYDROCOD POLI-CHLORPHE POLI ER 10-8 MG/5ML PO SUER: 5.0000 mL | Freq: Two times a day (BID) | ORAL | 0 refills | Status: DC | PRN

## 2023-07-10 NOTE — Progress Notes (Signed)
 Acute Office Visit  Subjective:     Patient ID: Clifford Gomez, male    DOB: 09-05-1955, 68 y.o.   MRN: [id number]  Chief Complaint  Patient presents with   Cough    With sinus drainage, congestion, sore throat, exposed to strep   HPI:  Discussed the use of AI scribe software for clinical note transcription with the patient, who gave verbal consent to proceed.  History of Present Illness   Clifford Gomez is a 68 year old male who presents with throat irritation and persistent cough.  Symptoms began last week in [place]  with throat irritation, progressing to difficulty swallowing and a persistent cough by Friday. He experiences sinus pressure described as 'on fire' but has no fever. A close contact was diagnosed with strep throat, and he had potential exposure during a service event. He uses Advil for pressure relief and Mucinex DM for congestion. Over-the-counter and prescription cough syrups are used to manage symptoms and improve sleep. He typically recovers quickly from similar illnesses and does not usually get strep throat. He describes the cough as 'horrendous' with phlegm production. No shortness of breath.      ROS See pertinent positives and negatives per HPI.     Objective:    BP 116/74 (BP Location: Left Arm, Patient Position: Sitting, Cuff Size: Normal)   Pulse 84   Temp 98.6 F (37 C) (Oral)   Ht 5\' 10"  (1.778 m)   Wt 203 lb 11.2 oz (92.4 kg)   SpO2 96%   BMI 29.23 kg/m    Physical Exam Vitals and nursing note reviewed.  Constitutional:      Appearance: Normal appearance.  HENT:     Head: Normocephalic.     Right Ear: Tympanic membrane, ear canal and external ear normal.     Left Ear: Tympanic membrane, ear canal and external ear normal.     Nose:     Right Sinus: Maxillary sinus tenderness present. No frontal sinus tenderness.     Left Sinus: Maxillary sinus tenderness present. No frontal sinus tenderness.     Mouth/Throat:     Mouth: Mucous  membranes are moist.     Pharynx: Posterior oropharyngeal erythema present. No oropharyngeal exudate.  Eyes:     Conjunctiva/sclera: Conjunctivae normal.  Cardiovascular:     Rate and Rhythm: Normal rate and regular rhythm.     Pulses: Normal pulses.     Heart sounds: Normal heart sounds.  Pulmonary:     Effort: Pulmonary effort is normal.     Breath sounds: Normal breath sounds.  Musculoskeletal:     Cervical back: Normal range of motion and neck supple. No tenderness.  Lymphadenopathy:     Cervical: No cervical adenopathy.  Skin:    General: Skin is warm.  Neurological:     General: No focal deficit present.     Mental Status: He is alert and oriented to person, place, and time.  Psychiatric:        Mood and Affect: Mood normal.        Behavior: Behavior normal.        Thought Content: Thought content normal.        Judgment: Judgment normal.     Results for orders placed or performed in visit on 07/10/23  POCT rapid strep A  Result Value Ref Range   Rapid Strep A Screen Positive (A) Negative        Assessment & Plan:   Streptococcal Pharyngitis  A positive strep test confirms the diagnosis. He was educated on the contagious nature of the condition and advised on precautions. Prescribe amoxicillin  with clavulanate twice daily for ten days as he may be starting to get a sinus infection as well.    Upper Respiratory Infection Prescribe Tussinex cough syrup for nocturnal use as needed. Advise continuation of Mucinex for congestion relief. Encourage fluids, rest. Instruct to contact the clinic if symptoms do not improve.    Meds ordered this encounter  Medications   amoxicillin -clavulanate (AUGMENTIN ) 875-125 MG tablet    Sig: Take 1 tablet by mouth 2 (two) times daily.    Dispense:  20 tablet    Refill:  0   chlorpheniramine-HYDROcodone (TUSSIONEX) 10-8 MG/5ML    Sig: Take 5 mLs by mouth every 12 (twelve) hours as needed for cough.    Dispense:  70 mL    Refill:   0    Return if symptoms worsen or fail to improve.  Odette Benjamin, NP

## 2023-07-10 NOTE — Patient Instructions (Signed)
 It was great to see you!  Start augmentin  twice a day for 10 days  Start cough syrup as needed at bedtime   Keep drinking plenty of fluids  You can still take mucinex  Let's follow-up if your symptoms worsen or don't improve   Take care,  Rheba Cedar, NP

## 2023-07-12 NOTE — Telephone Encounter (Signed)
Left third message for patient to call back.

## 2023-07-16 ENCOUNTER — Encounter: Payer: Self-pay | Admitting: Family Medicine

## 2023-07-19 ENCOUNTER — Other Ambulatory Visit: Payer: Self-pay

## 2023-07-19 ENCOUNTER — Encounter: Payer: Self-pay | Admitting: Cardiovascular Disease

## 2023-07-19 ENCOUNTER — Other Ambulatory Visit (HOSPITAL_COMMUNITY): Payer: Self-pay

## 2023-07-19 ENCOUNTER — Ambulatory Visit: Payer: Medicare Other | Attending: Cardiovascular Disease | Admitting: Cardiovascular Disease

## 2023-07-19 ENCOUNTER — Telehealth: Payer: Self-pay

## 2023-07-19 VITALS — BP 138/80 | HR 74 | Ht 70.0 in | Wt 205.0 lb

## 2023-07-19 DIAGNOSIS — E785 Hyperlipidemia, unspecified: Secondary | ICD-10-CM | POA: Diagnosis not present

## 2023-07-19 DIAGNOSIS — I10 Essential (primary) hypertension: Secondary | ICD-10-CM | POA: Diagnosis not present

## 2023-07-19 NOTE — Patient Instructions (Signed)
 Medication Instructions:  Your physician recommends that you continue on your current medications as directed. Please refer to the Current Medication list given to you today.  *If you need a refill on your cardiac medications before your next appointment, please call your pharmacy*  Lab Work: TO BE DONE 3 MONTHS: LIPID AND LFTS  If you have labs (blood work) drawn today and your tests are completely normal, you will receive your results only by: MyChart Message (if you have MyChart) OR A paper copy in the mail If you have any lab test that is abnormal or we need to change your treatment, we will call you to review the results.  Testing/Procedures: NONE  Follow-Up: At [facility], you and your health needs are our priority.  As part of our continuing mission to provide you with exceptional heart care, our providers are all part of one team.  This team includes your primary Cardiologist (physician) and Advanced Practice Providers or APPs (Physician Assistants and Nurse Practitioners) who all work together to provide you with the care you need, when you need it.  Your next appointment:   6 month(s)  Provider:   Janelle Mediate, MD   We recommend signing up for the patient portal called MyChart.  Sign up information is provided on this After Visit Summary.  MyChart is used to connect with patients for Virtual Visits (Telemedicine).  Patients are able to view lab/test results, encounter notes, upcoming appointments, etc.  Non-urgent messages can be sent to your provider as well.   To learn more about what you can do with MyChart, go to [email or web address].

## 2023-07-19 NOTE — Telephone Encounter (Signed)
 Pharmacy Patient Advocate Encounter   Received notification from Physician's Office that prior authorization for Praluent  150MG /ML auto-injectors is required/requested.   Insurance verification completed.   The patient is insured through [facility] ADVANTAGE/RX ADVANCE .   Per test claim: PA required; PA submitted to above mentioned insurance via CoverMyMeds Key/confirmation #/EOC (Key: [id number])    Status is pending

## 2023-07-20 ENCOUNTER — Other Ambulatory Visit (HOSPITAL_COMMUNITY): Payer: Self-pay

## 2023-07-20 NOTE — Telephone Encounter (Signed)
 Pharmacy Patient Advocate Encounter  Received notification from [facility] ADVANTAGE/RX ADVANCE that Prior Authorization for Praluent  150MG /ML auto-injectors has been DENIED.  Full denial letter will be uploaded to the media tab. See denial reason below.   Pt plan requires recent LDL labs within the last 120 days

## 2023-07-25 ENCOUNTER — Encounter: Payer: Self-pay | Admitting: Cardiovascular Disease

## 2023-07-26 DIAGNOSIS — I10 Essential (primary) hypertension: Secondary | ICD-10-CM | POA: Diagnosis not present

## 2023-07-26 DIAGNOSIS — E785 Hyperlipidemia, unspecified: Secondary | ICD-10-CM | POA: Diagnosis not present

## 2023-07-26 LAB — LIPID PANEL

## 2023-07-27 ENCOUNTER — Ambulatory Visit: Payer: Self-pay | Admitting: Cardiovascular Disease

## 2023-07-27 DIAGNOSIS — I10 Essential (primary) hypertension: Secondary | ICD-10-CM

## 2023-07-27 DIAGNOSIS — E785 Hyperlipidemia, unspecified: Secondary | ICD-10-CM

## 2023-07-27 LAB — LIPID PANEL
Cholesterol, Total: 147 mg/dL (ref 100–199)
HDL: 51 mg/dL (ref >39)
LDL CALC COMMENT:: 2.9 ratio (ref 0.0–5.0)
LDL Chol Calc (NIH): 83 mg/dL (ref 0–99)
Triglycerides: 67 mg/dL (ref 0–149)
VLDL Cholesterol Cal: 13 mg/dL (ref 5–40)

## 2023-07-27 LAB — HEPATIC FUNCTION PANEL
ALT: 29 IU/L (ref 0–44)
AST: 29 IU/L (ref 0–40)
Albumin: 4.6 g/dL (ref 3.9–4.9)
Alkaline Phosphatase: 65 IU/L (ref 44–121)
Bilirubin Total: 0.6 mg/dL (ref 0.0–1.2)
Bilirubin, Direct: 0.2 mg/dL (ref 0.00–0.40)
Total Protein: 7.1 g/dL (ref 6.0–8.5)

## 2023-07-27 NOTE — Telephone Encounter (Signed)
 Records including updated labs faxed to [facility]

## 2023-08-03 MED ORDER — PRALUENT 150 MG/ML ~~LOC~~ SOAJ: 150.0000 mg | SUBCUTANEOUS | 12 refills | Status: AC

## 2023-08-22 ENCOUNTER — Encounter: Payer: Self-pay | Admitting: Cardiovascular Disease

## 2023-08-22 MED ORDER — ROSUVASTATIN CALCIUM 5 MG PO TABS: 5.0000 mg | ORAL_TABLET | ORAL | 3 refills | Status: DC

## 2023-08-22 NOTE — Telephone Encounter (Signed)
-----   Message from Maude Emmer sent at 08/15/2023  8:35 AM EDT ----- LDL goal is < 70 ideally he would take crestor  5 mg with PSK9 If he is willing can start with crestor  5 mg M/W/F and repeat labs in 3 m onths ----- Message ----- From: Drena Noel Males, RN Sent: 08/14/2023   3:24 PM EDT To: Maude JAYSON Emmer, MD  Called patient about lab work. Patient stated he is only taking praluent . Patient stated he never started the crestor , and he felt like he did not need to after seeing lab results. Will make Dr.  Emmer aware.  ----- Message ----- From: Emmer Maude JAYSON, MD Sent: 07/27/2023  10:45 AM EDT To: Males Drena Noel, RN  ? Is he taking praluent  and crestor  LDL much better 174-> 83  ----- Message ----- From: Interface, [facility] Lab Results In Sent: 07/26/2023  11:41 PM EDT To: Maude JAYSON Emmer, MD

## 2023-09-07 ENCOUNTER — Ambulatory Visit

## 2023-09-13 ENCOUNTER — Ambulatory Visit

## 2023-09-13 VITALS — Ht 70.0 in | Wt 205.0 lb

## 2023-09-13 DIAGNOSIS — Z0000 Encounter for general adult medical examination without abnormal findings: Secondary | ICD-10-CM | POA: Diagnosis not present

## 2023-09-13 NOTE — Progress Notes (Signed)
 Subjective:   Clifford Gomez is a 68 y.o. who presents for a Medicare Wellness preventive visit.  As a reminder, Annual Wellness Visits don't include a physical exam, and some assessments may be limited, especially if this visit is performed virtually. We may recommend an in-person follow-up visit with your provider if needed.  Visit Complete: Virtual I connected with  Clifford Gomez on 09/13/23 by a audio enabled telemedicine application and verified that I am speaking with the correct person using two identifiers.  Patient Location: Home  Provider Location: Home Office  I discussed the limitations of evaluation and management by telemedicine. The patient expressed understanding and agreed to proceed.  Vital Signs: Because this visit was a virtual/telehealth visit, some criteria may be missing or patient reported. Any vitals not documented were not able to be obtained and vitals that have been documented are patient reported.  VideoDeclined- This patient declined Librarian, academic. Therefore the visit was completed with audio only.  Persons Participating in Visit: Patient.  AWV Questionnaire: No: Patient Medicare AWV questionnaire was not completed prior to this visit.  Cardiac Risk Factors include: advanced age (>64men, >63 women);dyslipidemia;male gender     Objective:    Today's Vitals   09/13/23 0808  Weight: 205 lb (93 kg)  Height: 5' 10 (1.778 m)  PainSc: 0-No pain   Body mass index is 29.41 kg/m.     09/13/2023    8:10 AM 08/25/2022   10:29 AM 05/08/2022    2:08 PM  Advanced Directives  Does Patient Have a Medical Advance Directive? No No Yes  Type of Advance Directive   Living will;Healthcare Power of Attorney  Copy of Healthcare Power of Attorney in Chart?   No - copy requested  Would patient like information on creating a medical advance directive? No - Patient declined      Current Medications (verified) Outpatient Encounter  Medications as of 09/13/2023  Medication Sig   Alirocumab  (PRALUENT ) 150 MG/ML SOAJ Inject 1 mL (150 mg total) into the skin every 14 (fourteen) days.   cetirizine (ZYRTEC) 10 MG tablet Take 1 tablet by mouth daily as needed.   diphenhydrAMINE (BENADRYL) 25 mg capsule Take 1 capsule by mouth daily as needed.   ezetimibe (ZETIA) 10 MG tablet Take 5 mg by mouth 3 (three) times a week.   IBUPROFEN IB PO Take 1 tablet by mouth daily as needed.   pantoprazole  (PROTONIX ) 40 MG tablet Take 1 tablet (40 mg total) by mouth daily as needed.   tadalafil  (CIALIS ) 20 MG tablet Take 1 tablet (20 mg total) by mouth daily as needed for erectile dysfunction.   allopurinol (ZYLOPRIM) 300 MG tablet Take 300 mg by mouth daily. (Patient not taking: Reported on 09/13/2023)   naproxen  (NAPROSYN ) 500 MG tablet Take 1 tablet (500 mg total) by mouth 2 (two) times daily with a meal. (Patient not taking: Reported on 07/19/2023)   valACYclovir (VALTREX) 1000 MG tablet  (Patient not taking: Reported on 09/13/2023)   [DISCONTINUED] amoxicillin -clavulanate (AUGMENTIN ) 875-125 MG tablet Take 1 tablet by mouth 2 (two) times daily. (Patient not taking: Reported on 09/13/2023)   [DISCONTINUED] chlorpheniramine-HYDROcodone (TUSSIONEX) 10-8 MG/5ML Take 5 mLs by mouth every 12 (twelve) hours as needed for cough. (Patient not taking: Reported on 09/13/2023)   [DISCONTINUED] rosuvastatin  (CRESTOR ) 5 MG tablet Take 1 tablet (5 mg total) by mouth every Monday, Wednesday, and Friday. (Patient not taking: Reported on 09/13/2023)   No facility-administered encounter medications on file  as of 09/13/2023.    Allergies (verified) Ezetimibe, Pravastatin, and Simvastatin   History: Past Medical History:  Diagnosis Date   Allergy to alpha-gal    Anxiety    Arthritis    joints   BMI 28.0-28.9,adult    Cervical (neck) region somatic dysfunction    Cervical paraspinal muscle spasm    Cervical radiculopathy    Chest pain    Depression    Diarrhea     ED (erectile dysfunction)    Fainting spell    10 years ago   GERD (gastroesophageal reflux disease)    Gout    History of colonic polyps    HSV-1 infection    Hyperlipidemia    on meds   Hypertension    on meds   Intracranial arachnoid cyst    Low back pain    Melanocytic nevi of trunk    Memory impairment    Ocular migraine    Olecranon bursitis, right elbow    Perennial allergic rhinitis    Scapular dyskinesis    Seasonal allergies    Sensorineural hearing loss (SNHL) of left ear    Statin intolerance    Past Surgical History:  Procedure Laterality Date   ANKLE SURGERY Right    COLONOSCOPY  2015   JP-MAC-movi(exc)-TA   POLYPECTOMY     TONSILLECTOMY     WISDOM TOOTH EXTRACTION     Family History  Problem Relation Age of Onset   Hyperlipidemia Mother    Dementia Mother    Hypertension Father    Hyperlipidemia Father    Breast cancer Sister    Hypertension Sister    Cancer Brother        prostrate   Hypertension Brother    Stroke Paternal Aunt    Cancer Maternal Grandmother    Diabetes Maternal Grandfather    Heart disease Maternal Grandfather    Alcohol abuse Paternal Grandfather    Colon cancer Neg Hx    Stomach cancer Neg Hx    Colon polyps Neg Hx    Esophageal cancer Neg Hx    Rectal cancer Neg Hx    Social History   Socioeconomic History   Marital status: Significant Other    Spouse name: Not on file   Number of children: 0   Years of education: Not on file   Highest education level: Bachelor's degree (e.g., BA, AB, BS)  Occupational History   Occupation: Psychologist, sport and exercise    Comment: Careers adviser, Personal assistant  Tobacco Use   Smoking status: Never   Smokeless tobacco: Never  Vaping Use   Vaping status: Never Used  Substance and Sexual Activity   Alcohol use: Yes    Alcohol/week: 16.0 standard drinks of alcohol    Types: 16 Standard drinks or equivalent per week    Comment: occasional   Drug use: No   Sexual activity: Yes   Other Topics Concern   Not on file  Social History Narrative   Guardian for a 68 year-old   Social Drivers of Corporate investment banker Strain: Low Risk  (09/13/2023)   Overall Financial Resource Strain (CARDIA)    Difficulty of Paying Living Expenses: Not hard at all  Food Insecurity: No Food Insecurity (09/13/2023)   Hunger Vital Sign    Worried About Running Out of Food in the Last Year: Never true    Ran Out of Food in the Last Year: Never true  Transportation Needs: No Transportation Needs (09/13/2023)  PRAPARE - Administrator, Civil Service (Medical): No    Lack of Transportation (Non-Medical): No  Physical Activity: Sufficiently Active (09/13/2023)   Exercise Vital Sign    Days of Exercise per Week: 7 days    Minutes of Exercise per Session: 30 min  Stress: No Stress Concern Present (09/13/2023)   [employer] of Occupational Health - Occupational Stress Questionnaire    Feeling of Stress: Not at all  Social Connections: Unknown (09/13/2023)   Social Connection and Isolation Panel    Frequency of Communication with Friends and Family: More than three times a week    Frequency of Social Gatherings with Friends and Family: More than three times a week    Attends Religious Services: Patient declined    Database administrator or Organizations: Patient declined    Attends Engineer, structural: Patient declined    Marital Status: Patient declined    Tobacco Counseling Counseling given: Yes    Clinical Intake:  Pre-visit preparation completed: Yes  Pain : No/denies pain Pain Score: 0-No pain     BMI - recorded: 29.41 Nutritional Status: BMI 25 -29 Overweight Nutritional Risks: None Diabetes: No  No results found for: HGBA1C   How often do you need to have someone help you when you read instructions, pamphlets, or other written materials from your doctor or pharmacy?: 1 - Never  Interpreter Needed?: No  Information entered by :: Stefano ORN  [facility]   Activities of Daily Living     09/13/2023    8:11 AM  In your present state of health, do you have any difficulty performing the following activities:  Hearing? 0  Vision? 0  Difficulty concentrating or making decisions? 0  Walking or climbing stairs? 0  Dressing or bathing? 0  Doing errands, shopping? 0  Preparing Food and eating ? N  Using the Toilet? N  In the past six months, have you accidently leaked urine? N  Do you have problems with loss of bowel control? N  Managing your Medications? N  Managing your Finances? N  Housekeeping or managing your Housekeeping? N    Patient Care Team: Thedora Garnette HERO, MD as PCP - General (Family Medicine) Delford Maude BROCKS, MD as PCP - Cardiology (Cardiology) Cleotilde Sewer, OD (Optometry)  I have updated your Care Teams any recent Medical Services you may have received from other providers in the past year.     Assessment:   This is a routine wellness examination for Rudra.  Hearing/Vision screen Hearing Screening - Comments:: Patient denies any hearing difficulties.   Vision Screening - Comments:: Wears rx glasses - up to date with routine eye exams with  Dr. Sewer Cleotilde   Goals Addressed             This Visit's Progress    Patient Stated       I would like to get one of my houses renovated        Depression Screen     09/13/2023    8:40 AM 11/09/2022    8:10 AM 08/25/2022   10:30 AM 08/17/2022    8:53 AM 04/05/2022    8:22 AM  PHQ 2/9 Scores  PHQ - 2 Score 0 0 0 0 0  PHQ- 9 Score 0  0      Fall Risk     09/13/2023    8:14 AM 11/09/2022    8:09 AM 08/25/2022   10:29 AM 08/17/2022  8:52 AM 04/05/2022    8:22 AM  Fall Risk   Falls in the past year? 0 0 0 0 0  Number falls in past yr: 0 0 0 0 0  Injury with Fall? 0 0 0 0 0  Risk for fall due to : No Fall Risks No Fall Risks Medication side effect No Fall Risks History of fall(s);No Fall Risks  Follow up Falls evaluation completed;Education  provided;Falls prevention discussed Falls evaluation completed Falls prevention discussed;Falls evaluation completed Falls evaluation completed Falls evaluation completed    MEDICARE RISK AT HOME:  Medicare Risk at Home Any stairs in or around the home?: Yes If so, are there any without handrails?: No Home free of loose throw rugs in walkways, pet beds, electrical cords, etc?: Yes Adequate lighting in your home to reduce risk of falls?: Yes Life alert?: No Use of a cane, walker or w/c?: No Grab bars in the bathroom?: No Shower chair or bench in shower?: Yes Elevated toilet seat or a handicapped toilet?: Yes  TIMED UP AND GO:  Was the test performed?  No  Cognitive Function: 6CIT completed        09/13/2023    8:15 AM 08/25/2022   10:30 AM  6CIT Screen  What Year? 0 points 0 points  What month? 0 points 0 points  What time? 0 points 0 points  Count back from 20 0 points 0 points  Months in reverse 0 points 0 points  Repeat phrase 0 points 0 points  Total Score 0 points 0 points    Immunizations Immunization History  Administered Date(s) Administered   PNEUMOCOCCAL CONJUGATE-20 08/17/2022   Td 02/06/2013    Screening Tests Health Maintenance  Topic Date Due   COVID-19 Vaccine (1) Never done   Zoster Vaccines- Shingrix (1 of 2) Never done   DTaP/Tdap/Td (2 - Tdap) 02/07/2023   INFLUENZA VACCINE  09/07/2023   Medicare Annual Wellness (AWV)  09/12/2024   Colonoscopy  08/05/2025   Pneumococcal Vaccine: 50+ Years  Completed   Hepatitis C Screening  Completed   Hepatitis B Vaccines  Aged Out   HPV VACCINES  Aged Out   Meningococcal B Vaccine  Aged Out    Health Maintenance  Health Maintenance Due  Topic Date Due   COVID-19 Vaccine (1) Never done   Zoster Vaccines- Shingrix (1 of 2) Never done   DTaP/Tdap/Td (2 - Tdap) 02/07/2023   INFLUENZA VACCINE  09/07/2023   Health Maintenance Items Addressed: Routine health screenings are up to date. Patient is aware of  recommended vaccines.   Additional Screening:  Vision Screening: Recommended annual ophthalmology exams for early detection of glaucoma and other disorders of the eye. Would you like a referral to an eye doctor? No    Dental Screening: Recommended annual dental exams for proper oral hygiene  Community Resource Referral / Chronic Care Management: CRR required this visit?  No   CCM required this visit?  No   Plan:    I have personally reviewed and noted the following in the patient's chart:   Medical and social history Use of alcohol, tobacco or illicit drugs  Current medications and supplements including opioid prescriptions. Patient is not currently taking opioid prescriptions. Functional ability and status Nutritional status Physical activity Advanced directives List of other physicians Hospitalizations, surgeries, and ER visits in previous 12 months Vitals Screenings to include cognitive, depression, and falls Referrals and appointments  In addition, I have reviewed and discussed with patient certain preventive protocols,  quality metrics, and best practice recommendations. A written personalized care plan for preventive services as well as general preventive health recommendations were provided to patient.   Camber Ninh, CMA   09/13/2023   After Visit Summary: (MyChart) Due to this being a telephonic visit, the after visit summary with patients personalized plan was offered to patient via MyChart   Notes: Nothing significant to report at this time.

## 2023-09-13 NOTE — Patient Instructions (Signed)
 [name] , Thank you for taking time out of your busy schedule to complete your Annual Wellness Visit with me. I enjoyed our conversation and look forward to speaking with you again next year. I, as well as your care team,  appreciate your ongoing commitment to your health goals. Please review the following plan we discussed and let me know if I can assist you in the future. Your Game plan/ To Do List    Referrals: If you haven't heard from the office you've been referred to, please reach out to them at the phone provided.  N/A  Follow up Visits: We will see or speak with you next year for your Next Medicare AWV with our clinical staff  Clinician Recommendations:  Aim for 30 minutes of exercise or brisk walking, 6-8 glasses of water, and 5 servings of fruits and vegetables each day.    Wishing you many blessings and good health during the next year until our next visit.  -[employer] Teegarden   This is a list of the screenings recommended for you:  Health Maintenance  Topic Date Due   COVID-19 Vaccine (1) Never done   Zoster (Shingles) Vaccine (1 of 2) Never done   DTaP/Tdap/Td vaccine (2 - Tdap) 02/07/2023   Flu Shot  09/07/2023   Medicare Annual Wellness Visit  09/12/2024   Colon Cancer Screening  08/05/2025   Pneumococcal Vaccine for age over 73  Completed   Hepatitis C Screening  Completed   Hepatitis B Vaccine  Aged Out   HPV Vaccine  Aged Out   Meningitis B Vaccine  Aged Out    Advanced directives: (Declined) Advance directive discussed with you today. Even though you declined this today, please call our office should you change your mind, and we can give you the proper paperwork for you to fill out. Advance Care Planning is important because it:  [x]  Makes sure you receive the medical care that is consistent with your values, goals, and preferences  [x]  It provides guidance to your family and loved ones and reduces their decisional burden about whether or not they are making the right  decisions based on your wishes.  Follow the link provided in your after visit summary or read over the paperwork we have mailed to you to help you started getting your Advance Directives in place. If you need assistance in completing these, please reach out to us  so that we can help you!  See attachments for Preventive Care and Fall Prevention Tips.

## 2023-10-16 ENCOUNTER — Encounter: Payer: Self-pay | Admitting: Family Medicine

## 2023-10-16 ENCOUNTER — Ambulatory Visit: Admitting: Family Medicine

## 2023-10-16 VITALS — BP 130/76 | HR 88 | Temp 97.4°F | Ht 70.0 in | Wt 202.2 lb

## 2023-10-16 DIAGNOSIS — Z0000 Encounter for general adult medical examination without abnormal findings: Secondary | ICD-10-CM | POA: Diagnosis not present

## 2023-10-16 DIAGNOSIS — Z125 Encounter for screening for malignant neoplasm of prostate: Secondary | ICD-10-CM

## 2023-10-16 DIAGNOSIS — Z23 Encounter for immunization: Secondary | ICD-10-CM | POA: Diagnosis not present

## 2023-10-16 DIAGNOSIS — I10 Essential (primary) hypertension: Secondary | ICD-10-CM | POA: Diagnosis not present

## 2023-10-16 DIAGNOSIS — E782 Mixed hyperlipidemia: Secondary | ICD-10-CM

## 2023-10-16 DIAGNOSIS — M545 Low back pain, unspecified: Secondary | ICD-10-CM | POA: Diagnosis not present

## 2023-10-16 DIAGNOSIS — G8929 Other chronic pain: Secondary | ICD-10-CM | POA: Diagnosis not present

## 2023-10-16 NOTE — Assessment & Plan Note (Signed)
 Blood pressure is acceptable. We will continue to monitor this off of meds. I will check annual renal labs.

## 2023-10-16 NOTE — Progress Notes (Signed)
 [facility] [address] [place] [address] Dept: [phone] Dept Fax: [phone]  Annual Physical Visit  Subjective:    Patient ID: Clifford Gomez, male    DOB: 1955-05-06, 68 y.o..   MRN: [id number]  Chief Complaint  Patient presents with   Annual Exam    CPE/labs.  Fasting today.   C/o having low back pain off/on.     History of Present Illness:  Patient is in today for an annual physical/preventative visit.  Mr. Shenker has a history of hypertension in the past. He is currently managed off of medication.   Mr. Monnin has a history of GERD. He manages this with pantoprazole  40 mg daily PRN.  Review of Systems  Constitutional:  Negative for chills, diaphoresis, fever, malaise/fatigue and weight loss.  HENT:  Negative for congestion, ear pain, hearing loss, sinus pain, sore throat and tinnitus.   Eyes:  Negative for blurred vision, pain, discharge and redness.  Respiratory:  Negative for cough, shortness of breath and wheezing.   Cardiovascular:  Negative for chest pain and palpitations.  Gastrointestinal:  Positive for heartburn. Negative for abdominal pain, constipation, diarrhea, nausea and vomiting.       Has occasional heartburn, esp. with spicy sauces. Uses pantoprazole  as needed.  Musculoskeletal:  Positive for back pain. Negative for joint pain and myalgias.       Notes an ongoing issue with low back pain, occurring on and off. He has had this for some years. Most cases seem to occur after a specific movement that causes a twinge of pain for a few days. More recently, he had a more major episode that caused an explosion of pain across his lower back. He denies any lower leg numbness, weakness or tingling. He notes he had some disc bulging ~ 20 years ago. He does do stretching for his back regularly.  Skin:  Negative for itching and rash.  Neurological:  Negative for tingling, sensory change and weakness.   Psychiatric/Behavioral:  Negative for depression. The patient is not nervous/anxious.    Past Medical History: Patient Active Problem List   Diagnosis Date Noted   Acute right ankle pain 11/09/2022   Sensorineural hearing loss (SNHL) of left ear 06/01/2022   Diarrhea 04/05/2022   Olecranon bursitis of right elbow 04/05/2022   Chronic night sweats 04/05/2022   GERD (gastroesophageal reflux disease) 04/05/2022   Perennial allergic rhinitis 04/05/2022   Erectile dysfunction 04/05/2022   Gout 04/05/2022   HSV-1 infection 04/05/2022   Allergy to alpha-gal 04/05/2022   Statin intolerance 04/05/2022   Intracranial arachnoid cyst 04/04/2022   Body mass index (BMI) 28.0-28.9, adult 07/22/2019   Essential (primary) hypertension 07/22/2019   Cervical radiculopathy 10/07/2013   History of colonic polyps 04/16/2013   Melanocytic nevi of trunk 05/08/2012   Memory impairment 05/08/2012   Atypical chest pain 05/08/2012   Hyperlipidemia    Past Surgical History:  Procedure Laterality Date   ANKLE SURGERY Right    COLONOSCOPY  2015   JP-MAC-movi(exc)-TA   POLYPECTOMY     TONSILLECTOMY     WISDOM TOOTH EXTRACTION     Family History  Problem Relation Age of Onset   Hyperlipidemia Mother    Dementia Mother    Hypertension Father    Hyperlipidemia Father    Breast cancer Sister    Hypertension Sister    Cancer Brother        prostrate   Hypertension Brother    Stroke Paternal  Aunt    Cancer Maternal Grandmother    Diabetes Maternal Grandfather    Heart disease Maternal Grandfather    Alcohol abuse Paternal Grandfather    Colon cancer Neg Hx    Stomach cancer Neg Hx    Colon polyps Neg Hx    Esophageal cancer Neg Hx    Rectal cancer Neg Hx    Outpatient Medications Prior to Visit  Medication Sig Dispense Refill   Alirocumab  (PRALUENT ) 150 MG/ML SOAJ Inject 1 mL (150 mg total) into the skin every 14 (fourteen) days. 2 mL 12   cetirizine (ZYRTEC) 10 MG tablet Take 1 tablet by  mouth daily as needed.     diphenhydrAMINE (BENADRYL) 25 mg capsule Take 1 capsule by mouth daily as needed.     IBUPROFEN IB PO Take 1 tablet by mouth daily as needed.     pantoprazole  (PROTONIX ) 40 MG tablet Take 1 tablet (40 mg total) by mouth daily as needed. 30 tablet 5   tadalafil  (CIALIS ) 20 MG tablet Take 1 tablet (20 mg total) by mouth daily as needed for erectile dysfunction. 10 tablet 2   ezetimibe (ZETIA) 10 MG tablet Take 5 mg by mouth 3 (three) times a week. (Patient not taking: Reported on 10/16/2023)     allopurinol (ZYLOPRIM) 300 MG tablet Take 300 mg by mouth daily. (Patient not taking: Reported on 09/13/2023)     naproxen  (NAPROSYN ) 500 MG tablet Take 1 tablet (500 mg total) by mouth 2 (two) times daily with a meal. (Patient not taking: Reported on 07/19/2023) 14 tablet 0   valACYclovir (VALTREX) 1000 MG tablet  (Patient not taking: Reported on 09/13/2023)     No facility-administered medications prior to visit.   Allergies  Allergen Reactions   Ezetimibe Other (See Comments)   Pravastatin Other (See Comments)    Muscle cramps   Simvastatin Other (See Comments)    Muscle cramps   Objective:   Today's Vitals   10/16/23 1252  BP: 130/76  Pulse: 88  Temp: (!) 97.4 F (36.3 C)  TempSrc: Temporal  SpO2: 98%  Weight: 202 lb 3.2 oz (91.7 kg)  Height: 5' 10 (1.778 m)   Body mass index is 29.01 kg/m.   General: Well developed, well nourished. No acute distress. HEENT: Normocephalic, non-traumatic. PERRL, EOMI. Conjunctiva clear. External ears normal. Right   EAC with wax. Left EAC and TM normal. Nose clear without congestion or rhinorrhea. Mucous   membranes moist. Oropharynx clear. Good dentition. Neck: Supple. No lymphadenopathy. No thyromegaly. Lungs: Clear to auscultation bilaterally. No wheezing, rales or rhonchi. CV: RRR without murmurs or rubs. Pulses 2+ bilaterally. Abdomen: Soft, non-tender. Bowel sounds positive, normal pitch and frequency. No    hepatosplenomegaly. No rebound or guarding. Back: Straight. Pain indicated over lower lumbar spine and paralumbar muscle columns. Extremities: Full ROM. No joint swelling or tenderness. No edema noted. - SLR bilat. Strength 5/5. Skin: Warm and dry. No rashes. Neuro: Normal sensation of LE and DTR 2+ bilaterally. Psych: Alert and oriented. Normal mood and affect.  Health Maintenance Due  Topic Date Due   Zoster Vaccines- Shingrix (1 of 2) Never done     Assessment & Plan:   Problem List Items Addressed This Visit       Cardiovascular and Mediastinum   Essential (primary) hypertension   Blood pressure is acceptable. We will continue to monitor this off of meds. I will check annual renal labs.      Relevant Orders   Basic metabolic  panel with GFR     Other   Annual physical exam - Primary   Overall health is good. Recommend regular exercise. Discussed recommended screenings and immunizations.       Chronic bilateral low back pain without sciatica   History and exam suggest muscular low back pain. There is no sign of a radiculopathy. I recommend referral to PT to work at strengthening of his back to prevent future recurrences. If still present in 6 weeks, we would consider imaging.      Relevant Orders   Ambulatory referral to Physical Therapy   Hyperlipidemia   Patient working with cardiology. Currently on alirocumab  (Praluent ). Cardiology had recommended addition of ezetimibe 3 days a week. He has not started this yet. Requests delaying check of lipids until he gets this in place.      Relevant Orders   Lipid panel   Other Visit Diagnoses       Screening for prostate cancer       Relevant Orders   PSA     Need for Tdap vaccination       Relevant Orders   Tdap vaccine greater than or equal to 7yo IM (Completed)       Return in about 6 months (around 04/14/2024) for Reassessment.   Garnette CHRISTELLA Simpler, MD

## 2023-10-16 NOTE — Assessment & Plan Note (Signed)
 Overall health is good. Recommend regular exercise. Discussed recommended screenings and immunizations.

## 2023-10-16 NOTE — Assessment & Plan Note (Signed)
 Patient working with cardiology. Currently on alirocumab  (Praluent ). Cardiology had recommended addition of ezetimibe 3 days a week. He has not started this yet. Requests delaying check of lipids until he gets this in place.

## 2023-10-16 NOTE — Assessment & Plan Note (Signed)
 History and exam suggest muscular low back pain. There is no sign of a radiculopathy. I recommend referral to PT to work at strengthening of his back to prevent future recurrences. If still present in 6 weeks, we would consider imaging.

## 2023-10-24 ENCOUNTER — Other Ambulatory Visit (INDEPENDENT_AMBULATORY_CARE_PROVIDER_SITE_OTHER)

## 2023-10-24 DIAGNOSIS — I10 Essential (primary) hypertension: Secondary | ICD-10-CM

## 2023-10-24 DIAGNOSIS — E782 Mixed hyperlipidemia: Secondary | ICD-10-CM

## 2023-10-24 DIAGNOSIS — E785 Hyperlipidemia, unspecified: Secondary | ICD-10-CM

## 2023-10-24 DIAGNOSIS — Z125 Encounter for screening for malignant neoplasm of prostate: Secondary | ICD-10-CM | POA: Diagnosis not present

## 2023-10-24 LAB — BASIC METABOLIC PANEL WITH GFR
BUN: 19 mg/dL (ref 6–23)
CO2: 26 meq/L (ref 19–32)
Calcium: 9.4 mg/dL (ref 8.4–10.5)
Chloride: 103 meq/L (ref 96–112)
Creatinine, Ser: 1.03 mg/dL (ref 0.40–1.50)
GFR: 74.72 mL/min (ref >60.00)
Glucose, Bld: 116 mg/dL — ABNORMAL HIGH (ref 70–99)
Potassium: 4.4 meq/L (ref 3.5–5.1)
Sodium: 137 meq/L (ref 135–145)

## 2023-10-24 LAB — PSA: PSA: 0.41 ng/mL (ref 0.10–4.00)

## 2023-10-24 LAB — LIPID PANEL
Cholesterol: 127 mg/dL (ref 0–200)
HDL: 53.5 mg/dL (ref >39.00)
LDL Cholesterol: 63 mg/dL (ref 0–99)
NonHDL: 73.21
Total CHOL/HDL Ratio: 2
Triglycerides: 50 mg/dL (ref 0.0–149.0)
VLDL: 10 mg/dL (ref 0.0–40.0)

## 2023-10-25 ENCOUNTER — Ambulatory Visit: Payer: Self-pay | Admitting: Cardiovascular Disease

## 2023-10-25 LAB — HEPATIC FUNCTION PANEL
ALT: 23 IU/L (ref 0–44)
AST: 26 IU/L (ref 0–40)
Albumin: 4.5 g/dL (ref 3.9–4.9)
Alkaline Phosphatase: 57 IU/L (ref 47–123)
Bilirubin Total: 0.7 mg/dL (ref 0.0–1.2)
Bilirubin, Direct: 0.24 mg/dL (ref 0.00–0.40)
Total Protein: 7.1 g/dL (ref 6.0–8.5)

## 2023-10-25 LAB — SPECIMEN STATUS REPORT

## 2023-10-26 ENCOUNTER — Ambulatory Visit: Payer: Self-pay | Admitting: Family

## 2023-12-11 DIAGNOSIS — D224 Melanocytic nevi of scalp and neck: Secondary | ICD-10-CM | POA: Diagnosis not present

## 2023-12-11 DIAGNOSIS — L570 Actinic keratosis: Secondary | ICD-10-CM | POA: Diagnosis not present

## 2023-12-11 DIAGNOSIS — D1801 Hemangioma of skin and subcutaneous tissue: Secondary | ICD-10-CM | POA: Diagnosis not present

## 2023-12-11 DIAGNOSIS — D225 Melanocytic nevi of trunk: Secondary | ICD-10-CM | POA: Diagnosis not present

## 2023-12-11 DIAGNOSIS — D2271 Melanocytic nevi of right lower limb, including hip: Secondary | ICD-10-CM | POA: Diagnosis not present

## 2023-12-11 DIAGNOSIS — L858 Other specified epidermal thickening: Secondary | ICD-10-CM | POA: Diagnosis not present

## 2023-12-11 DIAGNOSIS — D2262 Melanocytic nevi of left upper limb, including shoulder: Secondary | ICD-10-CM | POA: Diagnosis not present

## 2023-12-11 DIAGNOSIS — L821 Other seborrheic keratosis: Secondary | ICD-10-CM | POA: Diagnosis not present

## 2023-12-12 ENCOUNTER — Ambulatory Visit

## 2024-04-15 ENCOUNTER — Ambulatory Visit: Admitting: Family Medicine

## 2024-09-17 ENCOUNTER — Ambulatory Visit
